# Patient Record
Sex: Female | Born: 1970 | Race: Black or African American | Hispanic: No | Marital: Married | Smoking: Never smoker
Health system: Southern US, Community
[De-identification: ages and names within clinical notes are randomized; demographics above are authoritative.]

## PROBLEM LIST (undated history)

## (undated) DIAGNOSIS — Z9889 Other specified postprocedural states: Secondary | ICD-10-CM

## (undated) DIAGNOSIS — I10 Essential (primary) hypertension: Secondary | ICD-10-CM

## (undated) DIAGNOSIS — D649 Anemia, unspecified: Secondary | ICD-10-CM

## (undated) DIAGNOSIS — R112 Nausea with vomiting, unspecified: Secondary | ICD-10-CM

## (undated) DIAGNOSIS — N62 Hypertrophy of breast: Secondary | ICD-10-CM

## (undated) HISTORY — DX: Essential (primary) hypertension: I10

## (undated) HISTORY — PX: REDUCTION MAMMAPLASTY: SUR839

## (undated) HISTORY — PX: ENDOMETRIAL ABLATION: SHX621

---

## 2005-01-20 HISTORY — PX: OTHER SURGICAL HISTORY: SHX169

## 2005-03-14 ENCOUNTER — Other Ambulatory Visit: Admission: RE | Admit: 2005-03-14 | Discharge: 2005-03-14 | Payer: Self-pay | Admitting: Obstetrics and Gynecology

## 2005-06-26 ENCOUNTER — Encounter (INDEPENDENT_AMBULATORY_CARE_PROVIDER_SITE_OTHER): Payer: Self-pay | Admitting: *Deleted

## 2005-06-26 ENCOUNTER — Inpatient Hospital Stay (HOSPITAL_COMMUNITY): Admission: RE | Admit: 2005-06-26 | Discharge: 2005-06-28 | Payer: Self-pay | Admitting: Obstetrics and Gynecology

## 2008-03-30 ENCOUNTER — Encounter: Admission: RE | Admit: 2008-03-30 | Discharge: 2008-03-30 | Payer: Self-pay | Admitting: Obstetrics and Gynecology

## 2008-11-21 ENCOUNTER — Encounter: Admission: RE | Admit: 2008-11-21 | Discharge: 2008-11-21 | Payer: Self-pay | Admitting: Obstetrics and Gynecology

## 2010-06-07 NOTE — H&P (Signed)
NAME:  Rachael Conley, Rachael Conley NO.:  [id number]   MEDICAL RECORD NO.:  [id number]           PATIENT TYPE:   LOCATION:                                F[facility]ILITY:  [facility]   PHYSICIAN:  Zelphia Cairo, MD    DATE OF BIRTH:  [birth year]   DATE OF ADMISSION:  06/26/2005  DATE OF DISCHARGE:                                HISTORY & PHYSICAL   HISTORY OF PRESENT ILLNESS:  This is a 40 year old black female who  presented initially in November 2006 complaining of irregular bleeding.  She  reported that her menses had been irregular for quite some time and she had  also developed some left lower quadrant pain.  She was evaluated by a  physician in [place] and was told she had a fibroid the size of a tangerine.   PAST MEDICAL HISTORY:  Negative.   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   SOCIAL HISTORY:  Negative for tobacco, alcohol and drug use.  She is  divorced for 2 years.   OBSTETRICAL HISTORY:  OB history is significant for one molar pregnancy.  No  deliveries.   GYNECOLOGICAL HISTORY:  Essentially negative.   MEDICATIONS:  Seasonique.   PHYSICAL EXAMINATION:  VITAL SIGNS:  Height 5 feet 2 inches, weight 125,  blood pressure 120/78.  Hemoglobin initially 7 has increased to 11.2.  HEAD AND NECK:  Normal.  No thyromegaly.  HEART:  Regular rate and rhythm.  LUNGS:  Clear bilaterally.  ABDOMEN:  Soft and nontender.  There is a palpable mass about 2 cm below the  umbilicus.  PELVIC:  Exam reveals normal external female genitalia.  Vagina and cervix  are normal without lesions.  Cervix is tilted downward but there are no  lesions.  Uterus is enlarged about 18 weeks size.  There are no adnexal  masses palpable.   ULTRASOUND:  Ultrasound reveals a large intramural fibroid measuring 8.1 x  6.5 x 7.9 cm.  There are no adnexal masses or free fluid noted.   ASSESSMENT AND PLAN:  This is a 40 year old black female with uterine  fibroid.  Plan for myomectomy.   Risks, benefits and alternatives were  discussed.     Zelphia Cairo, MD  Electronically Signed    GA/MEDQ  D:  06/25/2005  T:  06/25/2005  Job:  [id number]

## 2010-06-07 NOTE — Op Note (Signed)
NAME:  Rachael Conley, Rachael Conley NO.:  [id number]   MEDICAL RECORD NO.:  [id number]          PATIENT TYPE:  INP   LOCATION:  9310                          FACILITY:  [facility]   PHYSICIAN:  Zelphia Cairo, MD    DATE OF BIRTH:  1970/02/18   DATE OF PROCEDURE:  06/26/2005  DATE OF DISCHARGE:                                 OPERATIVE REPORT   PREOPERATIVE DIAGNOSES:  1.  Uterine fibroids.  2.  Anemia.   POSTOPERATIVE DIAGNOSES:  1.  Uterine fibroids.  2.  Anemia.   PROCEDURE:  Abdominal myomectomy.   SURGEON:  Zelphia Cairo, M.D.   ASSISTANT:  Dineen Kid. Rana Snare, M.D.   ESTIMATED BLOOD LOSS:  300 cc.   COMPLICATIONS:  None.   CONDITION:  Stable and extubated to the recovery room.   SPECIMENS:  Uterine fibroids.   DESCRIPTION OF PROCEDURE:  The patient was taken to the operating room where  general anesthesia was obtained.  She was placed in the supine position and  prepped and draped in a sterile fashion.  A Foley catheter was placed in the  bladder prior to draping the patient.   A Pfannenstiel skin incision was made with the scalpel and carried down to  the underlying fascia.  The fascia was incised in the midline, and this was  extended laterally using Mayo scissors.  Kocher clamps were used to grasp  the superior portion of the fascia.  This was tented upwards and dissected  off the underlying rectus muscles using the Bovie.  The Kocher clamps were  then moved to the inferior portion of the fascia which was tented upwards  using Kocher clamps and dissected off the underlying rectus muscles using  the Bovie.  The peritoneum was then identified and entered sharply with  Metzenbaum scissors.  This was extended superiorly and inferiorly with good  visualization of the bladder.  Next, the patient was placed in slight  Trendelenburg position, and the O'Connor-O'Sullivan self-retaining retractor  was placed in the incision.  The bladder blade was inserted, and the  bowel  was packed using one moist lap.  The uterus was brought into the incision.  The large 8 cm uterine fibroid encompassed the entire uterus.  A Vicryl  stitch was placed at the uterine fundus for traction on the uterus.  A  vertical midline incision was made on the uterus using the scalpel.  This  was carried down to the fibroid.  Allis clamps were used to grasp the  uterine wall, and the intracavitary uterine fibroid was shelled out.  Once  the fibroid was removed, the uterine defect was reapproximated using 0  Vicryl pops in a figure-of-eight fashion.  All dead spaces were closed.  Next, the uterine wall was closed with a running stitch in two layers.  The  uterine serosa was reapproximated using 2-0 PDS in a baseball stitch  fashion.  Excellent hemostasis was noted.  Next, three small pedunculated  fibroids were removed from the uterus using the bovie.  These were all less  than 1 cm.  A figure-of-eight suture was placed to provide  hemostasis.  The  pelvis was irrigated, and hemostasis was assured.  Interceed was placed over  the uterine incision.  The bowel packing was removed, and the retractor was  released and removed from the abdomen.  The peritoneum was closed with  Vicryl.  The fascia was closed in a running fashion using 0 PDS, and the  skin was closed with a subcuticular stitch using 3-0 Vicryl.   The patient tolerated the procedure well.  Sponge, lap, needle, and  instrument counts were correct x2.  She received antibiotics prior to skin  incision and was taken to the recovery room extubated and in stable  condition.      Zelphia Cairo, MD  Electronically Signed     GA/MEDQ  D:  06/26/2005  T:  06/27/2005  Job:  [id number]

## 2010-06-07 NOTE — Discharge Summary (Signed)
NAME:  Rachael Conley, Rachael Conley NO.:  [id number]   MEDICAL RECORD NO.:  [id number]          PATIENT TYPE:  INP   LOCATION:  9310                          [facility]ILITY:  [facility]   PHYSICIAN:  Zelphia Cairo, MD    DATE OF BIRTH:  1970/10/08   DATE OF ADMISSION:  06/26/2005  DATE OF DISCHARGE:  06/28/2005                                 DISCHARGE SUMMARY   ADMISSION DIAGNOSIS:  Uterine fibroids.   PROCEDURE:  Abdominal myomectomy.   HOSPITAL COURSE:  Rachael Conley is a 40 year old black female who was admitted to  the hospital with a large uterine fibroid and underwent an abdominal  myomectomy.  Please see operative report for further details of the surgery.  She did well postoperatively and her pain was controlled with IV and p.o.  medications initially.  Postoperative day number one, she was noted to have anemia due to blood loss  at surgery and her hemoglobin was 6.6.  This was to be expected given her  anemia prior to surgery and the amount of blood loss during surgery.  She  was asymptomatic and declined blood transfusion.  She was started on iron  supplementation.  Foley catheter was removed and she was able to void and  ambulate without difficulty postoperative day one.  On postoperative day  number two, she had no complaints.  She was tolerating a regular diet  without nausea and vomiting.  She was ambulating without difficulty and her  pain was controlled with oral medications.  Her abdomen was soft and  nontender.  Her incision was clean, dry, and intact, and she was felt to be  stable for discharge.  She was discharged home with prescriptions for  Percocet and Rafleva and instructed to follow up in the office in  one week  for a postoperative check.      Zelphia Cairo, MD  Electronically Signed     GA/MEDQ  D:  07/26/2005  T:  07/26/2005  Job:  [id number]

## 2011-04-07 ENCOUNTER — Ambulatory Visit (INDEPENDENT_AMBULATORY_CARE_PROVIDER_SITE_OTHER): Payer: BC Managed Care – PPO | Admitting: Family Medicine

## 2011-04-07 VITALS — BP 143/86 | HR 73 | Temp 98.3°F | Resp 16 | Ht 62.0 in | Wt 138.0 lb

## 2011-04-07 DIAGNOSIS — R110 Nausea: Secondary | ICD-10-CM

## 2011-04-07 DIAGNOSIS — G44209 Tension-type headache, unspecified, not intractable: Secondary | ICD-10-CM

## 2011-04-07 DIAGNOSIS — R51 Headache: Secondary | ICD-10-CM

## 2011-04-07 MED ORDER — PROMETHAZINE HCL 25 MG PO TABS: 25.0000 mg | ORAL_TABLET | Freq: Three times a day (TID) | ORAL | Status: AC | PRN

## 2011-04-07 NOTE — Progress Notes (Signed)
  [facility]:  Office Visit  Chief Complaint:  Chief Complaint  Patient presents with  . Fatigue    x 2 weeks  . Dizziness  . Nausea    HPI: Rachael Conley is a 41 y.o. female who complains of  2weeks off and on HA, tried intermittent Advil and Tylenol without relief. No other sxs. Family h/o elevated BP. Feels this may be BP related but she has not kept a log, just knows it's high?  History reviewed. No pertinent past medical history. Past Surgical History  Procedure Date  . Myoectomy 2007   History   Social History  . Marital Status: Single    Spouse Name: N/A    Number of Children: N/A  . Years of Education: N/A   Social History Main Topics  . Smoking status: Never Smoker   . Smokeless tobacco: None  . Alcohol Use: Yes  . Drug Use: No  . Sexually Active: None   Other Topics Concern  . None   Social History Narrative  . None   Family History  Problem Relation Age of Onset  . Heart disease Mother   . Hypertension Mother   . Hypertension Father   . Hyperlipidemia Father   . Hypertension Sister   . Diabetes Sister    No Known Allergies Prior to Admission medications   Medication Sig Start Date End Date Taking? Authorizing Provider  promethazine (PHENERGAN) 25 MG tablet Take 1 tablet (25 mg total) by mouth every 8 (eight) hours as needed for nausea. 04/07/11 04/14/11   P , DO     ROS: The patient denies fevers, chills, night sweats, unintentional weight loss, chest pain, palpitations, wheezing, dyspnea on exertion, nausea, vomiting, abdominal pain, dysuria, hematuria, melena, numbness, weakness, or tingling. + fatigue, HA, dizziness  All other systems have been reviewed and were otherwise negative with the exception of those mentioned in the HPI and as above.    PHYSICAL EXAM: Filed Vitals:   04/07/11 1948  BP: 143/86  Pulse: 73  Temp: 98.3 F (36.8 C)  Resp: 16   Filed Vitals:   04/07/11 1948  Height: 5\' 2"  (1.575 m)    Weight: 138 lb (62.596 kg)   Body mass index is 25.24 kg/(m^2).  General: Alert, no acute distress HEENT:  Normocephalic, atraumatic, oropharynx patent. EOMI PERRLA fundoscopic exam nl Cardiovascular:  Regular rate and rhythm, no rubs murmurs or gallops.  No Carotid bruits, radial pulse intact. No pedal edema.  Respiratory: Clear to auscultation bilaterally.  No wheezes, rales, or rhonchi.  No cyanosis, no use of accessory musculature GI: No organomegaly, abdomen is soft and non-tender, positive bowel sounds.  No masses. Skin: No rashes. Neurologic: Facial musculature symmetric. CN 2-12 normal, no cerebellar abnomralities Psychiatric: Patient is appropriate throughout our interaction. Lymphatic: No cervical lymphadenopathy Musculoskeletal: Gait intact.   LABS: No results found for this or any previous visit.   EKG/XRAY:   Primary read interpreted by Dr. Conley Rolls at [facility].   ASSESSMENT/PLAN: Encounter Diagnoses  Name Primary?  . Nausea Yes  . Headache    1. BP-monitor, call in 1 week if sxs do not improve or sooner if BP >140/90 consistently in next 3 days or sxs worsen 2. Nausea-phenergen 3. HA-Excedrin and phenergan, sleep hygiene.     Hamilton Capri PHUONG, DO 04/07/2011 8:31 PM

## 2011-04-08 ENCOUNTER — Ambulatory Visit (INDEPENDENT_AMBULATORY_CARE_PROVIDER_SITE_OTHER): Payer: BC Managed Care – PPO | Admitting: Family Medicine

## 2011-04-08 VITALS — BP 138/95 | HR 71 | Temp 99.0°F | Resp 16 | Ht 62.75 in | Wt 137.6 lb

## 2011-04-08 DIAGNOSIS — I10 Essential (primary) hypertension: Secondary | ICD-10-CM

## 2011-04-08 MED ORDER — HYDROCHLOROTHIAZIDE 12.5 MG PO TABS: 12.5000 mg | ORAL_TABLET | Freq: Every day | ORAL | Status: DC

## 2011-04-08 NOTE — Progress Notes (Signed)
  [facility]:  Office Visit  Chief Complaint:  Chief Complaint  Patient presents with  . Hypertension    bp high at work 171/106  159/99    HPI: Rachael Conley is a 41 y.o. female who complains of elevated BP without diagnosis.  BP readings at home:  140/90, 151/116, 170/106, 159/99  + HA. NO chest Pain or SOB  No past medical history on file. Past Surgical History  Procedure Date  . Myoectomy 2007   History   Social History  . Marital Status: Single    Spouse Name: N/A    Number of Children: N/A  . Years of Education: N/A   Social History Main Topics  . Smoking status: Never Smoker   . Smokeless tobacco: Not on file  . Alcohol Use: Yes  . Drug Use: No  . Sexually Active: Not on file   Other Topics Concern  . Not on file   Social History Narrative  . No narrative on file   Family History  Problem Relation Age of Onset  . Heart disease Mother   . Hypertension Mother   . Hypertension Father   . Hyperlipidemia Father   . Hypertension Sister   . Diabetes Sister    No Known Allergies Prior to Admission medications   Medication Sig Start Date End Date Taking? Authorizing Provider  promethazine (PHENERGAN) 25 MG tablet Take 1 tablet (25 mg total) by mouth every 8 (eight) hours as needed for nausea. 04/07/11 04/14/11   P , DO     ROS: The patient denies fevers, chills, night sweats, unintentional weight loss, chest pain, palpitations, wheezing, dyspnea on exertion, nausea, vomiting, abdominal pain, dysuria, hematuria, melena, numbness, weakness, or tingling.+HA  All other systems have been reviewed and were otherwise negative with the exception of those mentioned in the HPI and as above.    PHYSICAL EXAM: Filed Vitals:   04/08/11 1430  BP: 138/95  Pulse: 71  Temp:   Resp:    Filed Vitals:   04/08/11 1428  Height: 5' 2.75" (1.594 m)  Weight: 137 lb 9.6 oz (62.415 kg)   Body mass index is 24.57 kg/(m^2).  General: Alert, no  acute distress HEENT:  Normocephalic, atraumatic, oropharynx patent.  Cardiovascular:  Regular rate and rhythm, no rubs murmurs or gallops.  No Carotid bruits, radial pulse intact. No pedal edema.  Respiratory: Clear to auscultation bilaterally.  No wheezes, rales, or rhonchi.  No cyanosis, no use of accessory musculature GI: No organomegaly, abdomen is soft and non-tender, positive bowel sounds.  No masses. Skin: No rashes. Neurologic: Facial musculature symmetric. Psychiatric: Patient is appropriate throughout our interaction. Lymphatic: No cervical lymphadenopathy Musculoskeletal: Gait intact.   LABS: No results found for this or any previous visit.   EKG/XRAY:   Primary read interpreted by Dr. Conley Rolls at [facility].   ASSESSMENT/PLAN: Encounter Diagnosis  Name Primary?  . HTN (hypertension) Yes   Newly dx. Rx HCTZ 12.5 mg. WIll keep BP log < 140/90. F/u in 1-2 months sooner as needed  BMP pending. D/w patient SEs of meds   ,  PHUONG, DO 04/08/2011 2:42 PM

## 2011-04-09 LAB — BASIC METABOLIC PANEL
BUN: 10 mg/dL (ref 6–23)
Calcium: 9.1 mg/dL (ref 8.4–10.5)
Creat: 0.81 mg/dL (ref 0.50–1.10)
Glucose, Bld: 91 mg/dL (ref 70–99)
Sodium: 137 mEq/L (ref 135–145)

## 2011-04-09 LAB — BASIC METABOLIC PANEL WITH GFR
CO2: 23 meq/L (ref 19–32)
Chloride: 106 meq/L (ref 96–112)
Potassium: 3.8 meq/L (ref 3.5–5.3)

## 2011-04-11 DIAGNOSIS — I10 Essential (primary) hypertension: Secondary | ICD-10-CM | POA: Insufficient documentation

## 2011-06-19 ENCOUNTER — Ambulatory Visit (INDEPENDENT_AMBULATORY_CARE_PROVIDER_SITE_OTHER): Payer: BC Managed Care – PPO | Admitting: Family Medicine

## 2011-06-19 VITALS — BP 130/80 | HR 71 | Temp 98.8°F | Resp 16 | Ht 62.5 in | Wt 135.0 lb

## 2011-06-19 DIAGNOSIS — I10 Essential (primary) hypertension: Secondary | ICD-10-CM

## 2011-06-19 LAB — BASIC METABOLIC PANEL
Calcium: 9.4 mg/dL (ref 8.4–10.5)
Sodium: 139 mEq/L (ref 135–145)

## 2011-06-19 MED ORDER — HYDROCHLOROTHIAZIDE 12.5 MG PO TABS: 12.5000 mg | ORAL_TABLET | Freq: Every day | ORAL | Status: DC

## 2011-06-19 NOTE — Progress Notes (Signed)
  Patient Name: Rachael Conley Date of Birth: Sep 10, 1970 Medical Record Number: [id number] Gender: female Date of Encounter: 06/19/2011  History of Present Illness:  Rachael Conley is a 41 y.o. very pleasant female patient who presents with the following:  Here today to recheck her BP.  At home her BP is running around 138/ 80.  She tried to stop her HCTZ but her BP went back up to around  140's/ 100.  She is doing well and feeling well with the medication.  She states there is no risk of her becoming pregnant. She has a strong family history of hypertension, and is exercising regularly.  She does not smoke- never has   Patient Active Problem List  Diagnoses  . HTN, goal below 140/90   No past medical history on file. Past Surgical History  Procedure Date  . Myoectomy 2007   History  Substance Use Topics  . Smoking status: Never Smoker   . Smokeless tobacco: Not on file  . Alcohol Use: Yes   Family History  Problem Relation Age of Onset  . Heart disease Mother   . Hypertension Mother   . Hypertension Father   . Hyperlipidemia Father   . Hypertension Sister   . Diabetes Sister    No Known Allergies  Medication list has been reviewed and updated.  Prior to Admission medications   Medication Sig Start Date End Date Taking? Authorizing Provider  hydrochlorothiazide (HYDRODIURIL) 12.5 MG tablet Take 1 tablet (12.5 mg total) by mouth daily. 04/08/11 04/07/12 Yes Thao P Le, DO    Review of Systems: As per HPI- otherwise negative. Feeling well  Physical Examination: Filed Vitals:   06/19/11 0805  BP: 130/80  Pulse: 71  Temp: 98.8 F (37.1 C)  Resp: 16   Filed Vitals:   06/19/11 0805  Height: 5' 2.5" (1.588 m)  Weight: 135 lb (61.236 kg)   Body mass index is 24.30 kg/(m^2).  GEN: WDWN, NAD, Non-toxic, A & O x 3 HEENT: Atraumatic, Normocephalic. Neck supple. No masses, No LAD.  PEERL Ears and Nose: No external deformity. CV: RRR, No M/G/R. No JVD. No thrill. No  extra heart sounds. PULM: CTA B, no wheezes, crackles, rhonchi. No retractions. No resp. distress. No accessory muscle use. EXTR: No c/c/e NEURO Normal gait.  PSYCH: Normally interactive. Conversant. Not depressed or anxious appearing.  Calm demeanor.    Assessment and Plan: 1. HTN (hypertension)  hydrochlorothiazide (HYDRODIURIL) 12.5 MG tablet, Basic metabolic panel   Doing well with current medication- will continue HCTZ 12.5, did RF for one year.  Check BMP- Will plan further follow- up pending labs. Will plan to recheck in 6-9 months.  Continue to exercise  Waynette Towers, MD 06/19/2011 8:07 AM

## 2011-06-20 ENCOUNTER — Encounter: Payer: Self-pay | Admitting: Family Medicine

## 2011-11-20 ENCOUNTER — Other Ambulatory Visit: Payer: Self-pay | Admitting: Obstetrics and Gynecology

## 2011-11-20 DIAGNOSIS — N63 Unspecified lump in unspecified breast: Secondary | ICD-10-CM

## 2011-11-27 ENCOUNTER — Ambulatory Visit
Admission: RE | Admit: 2011-11-27 | Discharge: 2011-11-27 | Disposition: A | Discharge status: Home / Self Care | Payer: BC Managed Care – PPO | Source: Ambulatory Visit | Attending: Obstetrics and Gynecology | Admitting: Obstetrics and Gynecology

## 2011-11-27 DIAGNOSIS — N63 Unspecified lump in unspecified breast: Secondary | ICD-10-CM

## 2012-05-03 ENCOUNTER — Other Ambulatory Visit: Payer: Self-pay | Admitting: Obstetrics and Gynecology

## 2012-05-03 DIAGNOSIS — N63 Unspecified lump in unspecified breast: Secondary | ICD-10-CM

## 2012-05-27 ENCOUNTER — Ambulatory Visit
Admission: RE | Admit: 2012-05-27 | Discharge: 2012-05-27 | Disposition: A | Discharge status: Home / Self Care | Payer: BC Managed Care – PPO | Source: Ambulatory Visit | Attending: Obstetrics and Gynecology | Admitting: Obstetrics and Gynecology

## 2012-05-27 ENCOUNTER — Other Ambulatory Visit: Payer: Self-pay | Admitting: Obstetrics and Gynecology

## 2012-05-27 DIAGNOSIS — N63 Unspecified lump in unspecified breast: Secondary | ICD-10-CM

## 2012-09-17 ENCOUNTER — Ambulatory Visit (INDEPENDENT_AMBULATORY_CARE_PROVIDER_SITE_OTHER): Payer: BC Managed Care – PPO | Admitting: Family Medicine

## 2012-09-17 VITALS — BP 124/78 | HR 55 | Temp 99.2°F | Resp 17 | Ht 62.5 in | Wt 137.0 lb

## 2012-09-17 DIAGNOSIS — I10 Essential (primary) hypertension: Secondary | ICD-10-CM

## 2012-09-17 MED ORDER — HYDROCHLOROTHIAZIDE 12.5 MG PO TABS
12.5000 mg | ORAL_TABLET | Freq: Every day | ORAL | Status: DC
Start: 2012-09-17 — End: 2013-11-10

## 2012-09-17 NOTE — Progress Notes (Signed)
  Subjective:    Patient ID: Rachael Conley, female    DOB: January 20, 1971, 42 y.o.   MRN: [id number]  HPI Rachael Conley is a 42 y.o. female PCP: [facility].   Follow up HTN. Last ov with Dr. Patsy Lager in 05/2011. Weight 135 then, 137 today. No recent outside Bp's.  Has been taking hctz 12.5mg  qd, no recent missed doses, no new side effects.  No chest pain/sob/dizziness. Started back exercise this week 2-3x/week.  Fasting this morning. Had cholesterol at job screening in January.   Results for orders placed in visit on 06/19/11  BASIC METABOLIC PANEL      Result Value Range   Sodium 139  135 - 145 mEq/L   Potassium 3.7  3.5 - 5.3 mEq/L   Chloride 103  96 - 112 mEq/L   CO2 29  19 - 32 mEq/L   Glucose, Bld 88  70 - 99 mg/dL   BUN 15  6 - 23 mg/dL   Creat 4.09  8.11 - 9.14 mg/dL   Calcium 9.4  8.4 - 78.2 mg/dL     Review of Systems  Constitutional: Negative for fatigue and unexpected weight change.  Respiratory: Negative for chest tightness and shortness of breath.   Cardiovascular: Negative for chest pain, palpitations and leg swelling.  Gastrointestinal: Negative for abdominal pain and blood in stool.  Neurological: Negative for dizziness, syncope, light-headedness and headaches.       Objective:   Physical Exam  Vitals reviewed. Constitutional: She is oriented to person, place, and time. She appears well-developed and well-nourished.  HENT:  Head: Normocephalic and atraumatic.  Eyes: Conjunctivae and EOM are normal. Pupils are equal, round, and reactive to light.  Neck: Carotid bruit is not present.  Cardiovascular: Normal rate, regular rhythm, normal heart sounds and intact distal pulses.   Pulmonary/Chest: Effort normal and breath sounds normal.  Abdominal: Soft. She exhibits no pulsatile midline mass. There is no tenderness.  Neurological: She is alert and oriented to person, place, and time.  Skin: Skin is warm and dry.  Psychiatric: She has a normal mood and affect. Her  behavior is normal.       Assessment & Plan:  Rachael Conley is a 42 y.o. female HTN (hypertension) - Plan: hydrochlorothiazide (HYDRODIURIL) 12.5 MG tablet, Comprehensive metabolic panel, Lipid panel  htn - controlled. Labs above, schedule physical with selected primary provider in next year. Bring outside BP's to next ov.   Meds ordered this encounter  Medications  . hydrochlorothiazide (HYDRODIURIL) 12.5 MG tablet    Sig: Take 1 tablet (12.5 mg total) by mouth daily.    Dispense:  90 tablet    Refill:  3   Patient Instructions  Keep a record of your blood pressures outside of the office and bring them to the next office visit. You should receive a call or letter about your lab results within the next week to 10 days.  Plan on physical with primary provider in next 6 months to a year.

## 2012-09-17 NOTE — Patient Instructions (Signed)
Keep a record of your blood pressures outside of the office and bring them to the next office visit. You should receive a call or letter about your lab results within the next week to 10 days.  Plan on physical with primary provider in next 6 months to a year.

## 2012-09-18 LAB — LIPID PANEL: Cholesterol: 189 mg/dL (ref 0–200)

## 2012-09-18 LAB — COMPREHENSIVE METABOLIC PANEL
ALT: 12 U/L (ref 0–35)
AST: 28 U/L (ref 0–37)
CO2: 28 mEq/L (ref 19–32)
Calcium: 9.5 mg/dL (ref 8.4–10.5)
Chloride: 103 mEq/L (ref 96–112)
Potassium: 3.6 mEq/L (ref 3.5–5.3)
Sodium: 139 mEq/L (ref 135–145)
Total Protein: 7.4 g/dL (ref 6.0–8.3)

## 2012-10-27 ENCOUNTER — Other Ambulatory Visit: Payer: Self-pay | Admitting: Obstetrics and Gynecology

## 2012-10-27 DIAGNOSIS — N649 Disorder of breast, unspecified: Secondary | ICD-10-CM

## 2012-11-29 ENCOUNTER — Ambulatory Visit
Admission: RE | Admit: 2012-11-29 | Discharge: 2012-11-29 | Disposition: A | Discharge status: Home / Self Care | Payer: BC Managed Care – PPO | Source: Ambulatory Visit | Attending: Obstetrics and Gynecology | Admitting: Obstetrics and Gynecology

## 2012-11-29 DIAGNOSIS — N649 Disorder of breast, unspecified: Secondary | ICD-10-CM

## 2013-05-24 ENCOUNTER — Other Ambulatory Visit: Payer: Self-pay | Admitting: Obstetrics and Gynecology

## 2013-05-24 DIAGNOSIS — D249 Benign neoplasm of unspecified breast: Secondary | ICD-10-CM

## 2013-06-15 ENCOUNTER — Encounter (HOSPITAL_BASED_OUTPATIENT_CLINIC_OR_DEPARTMENT_OTHER): Payer: Self-pay | Admitting: *Deleted

## 2013-06-20 ENCOUNTER — Ambulatory Visit
Admission: RE | Admit: 2013-06-20 | Discharge: 2013-06-20 | Disposition: A | Discharge status: Home / Self Care | Payer: BC Managed Care – PPO | Source: Ambulatory Visit | Attending: Obstetrics and Gynecology | Admitting: Obstetrics and Gynecology

## 2013-06-20 ENCOUNTER — Encounter (INDEPENDENT_AMBULATORY_CARE_PROVIDER_SITE_OTHER): Payer: Self-pay

## 2013-06-20 ENCOUNTER — Encounter (HOSPITAL_BASED_OUTPATIENT_CLINIC_OR_DEPARTMENT_OTHER)
Admission: RE | Admit: 2013-06-20 | Discharge: 2013-06-20 | Disposition: A | Discharge status: Home / Self Care | Payer: BC Managed Care – PPO | Source: Ambulatory Visit

## 2013-06-20 DIAGNOSIS — D249 Benign neoplasm of unspecified breast: Secondary | ICD-10-CM

## 2013-06-20 LAB — BASIC METABOLIC PANEL
BUN: 13 mg/dL (ref 6–23)
CHLORIDE: 101 meq/L (ref 96–112)
CO2: 27 meq/L (ref 19–32)
Calcium: 9.3 mg/dL (ref 8.4–10.5)
Creatinine, Ser: 0.95 mg/dL (ref 0.50–1.10)
GFR calc Af Amer: 84 mL/min — ABNORMAL LOW (ref >90)
GFR calc non Af Amer: 73 mL/min — ABNORMAL LOW (ref >90)
Glucose, Bld: 94 mg/dL (ref 70–99)
POTASSIUM: 3.6 meq/L — AB (ref 3.7–5.3)
Sodium: 139 mEq/L (ref 137–147)

## 2013-06-21 ENCOUNTER — Encounter (HOSPITAL_BASED_OUTPATIENT_CLINIC_OR_DEPARTMENT_OTHER)
Admission: RE | Disposition: A | Discharge status: Home / Self Care | Payer: Self-pay | Source: Ambulatory Visit | Attending: Plastic Surgery

## 2013-06-21 ENCOUNTER — Ambulatory Visit (HOSPITAL_BASED_OUTPATIENT_CLINIC_OR_DEPARTMENT_OTHER): Payer: BC Managed Care – PPO | Admitting: Anesthesiology

## 2013-06-21 ENCOUNTER — Encounter (HOSPITAL_BASED_OUTPATIENT_CLINIC_OR_DEPARTMENT_OTHER): Payer: Self-pay | Admitting: *Deleted

## 2013-06-21 ENCOUNTER — Ambulatory Visit (HOSPITAL_BASED_OUTPATIENT_CLINIC_OR_DEPARTMENT_OTHER)
Admission: RE | Admit: 2013-06-21 | Discharge: 2013-06-21 | Disposition: A | Discharge status: Home / Self Care | Payer: BC Managed Care – PPO | Source: Ambulatory Visit | Attending: Plastic Surgery | Admitting: Plastic Surgery

## 2013-06-21 ENCOUNTER — Encounter (HOSPITAL_BASED_OUTPATIENT_CLINIC_OR_DEPARTMENT_OTHER): Payer: BC Managed Care – PPO | Admitting: Anesthesiology

## 2013-06-21 DIAGNOSIS — D249 Benign neoplasm of unspecified breast: Secondary | ICD-10-CM | POA: Insufficient documentation

## 2013-06-21 DIAGNOSIS — N6019 Diffuse cystic mastopathy of unspecified breast: Secondary | ICD-10-CM | POA: Insufficient documentation

## 2013-06-21 DIAGNOSIS — I10 Essential (primary) hypertension: Secondary | ICD-10-CM | POA: Insufficient documentation

## 2013-06-21 DIAGNOSIS — N6089 Other benign mammary dysplasias of unspecified breast: Secondary | ICD-10-CM | POA: Insufficient documentation

## 2013-06-21 DIAGNOSIS — N62 Hypertrophy of breast: Secondary | ICD-10-CM | POA: Insufficient documentation

## 2013-06-21 DIAGNOSIS — Z79899 Other long term (current) drug therapy: Secondary | ICD-10-CM | POA: Insufficient documentation

## 2013-06-21 HISTORY — PX: BREAST REDUCTION SURGERY: SHX8

## 2013-06-21 HISTORY — DX: Hypertrophy of breast: N62

## 2013-06-21 HISTORY — DX: Anemia, unspecified: D64.9

## 2013-06-21 LAB — POCT HEMOGLOBIN-HEMACUE: HEMOGLOBIN: 12.2 g/dL (ref 12.0–15.0)

## 2013-06-21 SURGERY — MAMMOPLASTY, REDUCTION
Anesthesia: General | Site: Breast | Laterality: Bilateral

## 2013-06-21 MED ORDER — FENTANYL CITRATE 0.05 MG/ML IJ SOLN
INTRAMUSCULAR | Status: DC | PRN
  Administered 2013-06-21 (×4): 50 ug via INTRAVENOUS
  Administered 2013-06-21: 100 ug via INTRAVENOUS
  Administered 2013-06-21 (×2): 50 ug via INTRAVENOUS

## 2013-06-21 MED ORDER — LIDOCAINE HCL (CARDIAC) 20 MG/ML IV SOLN
INTRAVENOUS | Status: DC | PRN
  Administered 2013-06-21: 60 mg via INTRAVENOUS

## 2013-06-21 MED ORDER — CEFAZOLIN SODIUM 1-5 GM-% IV SOLN: 1.0000 g | Freq: Once | INTRAVENOUS | Status: DC

## 2013-06-21 MED ORDER — FENTANYL CITRATE 0.05 MG/ML IJ SOLN
INTRAMUSCULAR | Status: AC
  Filled 2013-06-21: qty 8

## 2013-06-21 MED ORDER — LIDOCAINE-EPINEPHRINE 1 %-1:100000 IJ SOLN
INTRAMUSCULAR | Status: DC | PRN
  Administered 2013-06-21: 40 mL

## 2013-06-21 MED ORDER — CEFAZOLIN SODIUM-DEXTROSE 2-3 GM-% IV SOLR
INTRAVENOUS | Status: AC
Start: 2013-06-21 — End: 2013-06-21
  Filled 2013-06-21: qty 50

## 2013-06-21 MED ORDER — PROPOFOL 10 MG/ML IV BOLUS
INTRAVENOUS | Status: DC | PRN
  Administered 2013-06-21: 200 mg via INTRAVENOUS

## 2013-06-21 MED ORDER — EPINEPHRINE HCL 1 MG/ML IJ SOLN
INTRAMUSCULAR | Status: AC
  Filled 2013-06-21: qty 1

## 2013-06-21 MED ORDER — BUPIVACAINE LIPOSOME 1.3 % IJ SUSP
INTRAMUSCULAR | Status: AC
  Filled 2013-06-21: qty 20

## 2013-06-21 MED ORDER — LACTATED RINGERS IV SOLN
INTRAVENOUS | Status: DC
  Administered 2013-06-21 (×2): via INTRAVENOUS

## 2013-06-21 MED ORDER — MIDAZOLAM HCL 2 MG/2ML IJ SOLN
INTRAMUSCULAR | Status: AC
  Filled 2013-06-21: qty 2

## 2013-06-21 MED ORDER — BUPIVACAINE-EPINEPHRINE (PF) 0.5% -1:200000 IJ SOLN
INTRAMUSCULAR | Status: AC
  Filled 2013-06-21: qty 30

## 2013-06-21 MED ORDER — LIDOCAINE HCL (PF) 1 % IJ SOLN
INTRAMUSCULAR | Status: AC
  Filled 2013-06-21: qty 30

## 2013-06-21 MED ORDER — MIDAZOLAM HCL 2 MG/2ML IJ SOLN: 1.0000 mg | INTRAMUSCULAR | Status: DC | PRN

## 2013-06-21 MED ORDER — CEFAZOLIN SODIUM-DEXTROSE 2-3 GM-% IV SOLR
INTRAVENOUS | Status: DC | PRN
  Administered 2013-06-21: 2 g via INTRAVENOUS

## 2013-06-21 MED ORDER — HYDROMORPHONE HCL PF 1 MG/ML IJ SOLN
0.2500 mg | INTRAMUSCULAR | Status: DC | PRN
  Administered 2013-06-21 (×2): 0.5 mg via INTRAVENOUS

## 2013-06-21 MED ORDER — BACITRACIN ZINC 500 UNIT/GM EX OINT
TOPICAL_OINTMENT | CUTANEOUS | Status: AC
Start: 2013-06-21 — End: 2013-06-21
  Filled 2013-06-21: qty 28.35

## 2013-06-21 MED ORDER — ONDANSETRON HCL 4 MG/2ML IJ SOLN: 4.0000 mg | Freq: Once | INTRAMUSCULAR | Status: DC | PRN

## 2013-06-21 MED ORDER — LIDOCAINE-EPINEPHRINE 1 %-1:100000 IJ SOLN
INTRAMUSCULAR | Status: AC
  Filled 2013-06-21: qty 1

## 2013-06-21 MED ORDER — DEXAMETHASONE SODIUM PHOSPHATE 4 MG/ML IJ SOLN
INTRAMUSCULAR | Status: DC | PRN
  Administered 2013-06-21: 10 mg via INTRAVENOUS

## 2013-06-21 MED ORDER — FENTANYL CITRATE 0.05 MG/ML IJ SOLN: 50.0000 ug | INTRAMUSCULAR | Status: DC | PRN

## 2013-06-21 MED ORDER — HYDROMORPHONE HCL PF 1 MG/ML IJ SOLN
INTRAMUSCULAR | Status: AC
  Filled 2013-06-21: qty 1

## 2013-06-21 MED ORDER — ROCURONIUM BROMIDE 100 MG/10ML IV SOLN
INTRAVENOUS | Status: DC | PRN
  Administered 2013-06-21: 35 mg via INTRAVENOUS

## 2013-06-21 MED ORDER — CEFAZOLIN SODIUM-DEXTROSE 2-3 GM-% IV SOLR: INTRAVENOUS | Status: DC | PRN

## 2013-06-21 MED ORDER — ONDANSETRON HCL 4 MG/2ML IJ SOLN
INTRAMUSCULAR | Status: DC | PRN
  Administered 2013-06-21: 4 mg via INTRAVENOUS

## 2013-06-21 MED ORDER — BACITRACIN ZINC 500 UNIT/GM EX OINT
TOPICAL_OINTMENT | CUTANEOUS | Status: DC | PRN
  Administered 2013-06-21: 1 via TOPICAL

## 2013-06-21 MED ORDER — MIDAZOLAM HCL 5 MG/5ML IJ SOLN
INTRAMUSCULAR | Status: DC | PRN
  Administered 2013-06-21: 2 mg via INTRAVENOUS

## 2013-06-21 MED ORDER — BUPIVACAINE LIPOSOME 1.3 % IJ SUSP
INTRAMUSCULAR | Status: DC | PRN
  Administered 2013-06-21: 20 mL

## 2013-06-21 SURGICAL SUPPLY — 60 items
APL SKNCLS STERI-STRIP NONHPOA (GAUZE/BANDAGES/DRESSINGS) ×2
BAG DECANTER FOR FLEXI CONT (MISCELLANEOUS) ×5 IMPLANT
BENZOIN TINCTURE PRP APPL 2/3 (GAUZE/BANDAGES/DRESSINGS) ×6 IMPLANT
BLADE KNIFE PERSONA 10 (BLADE) ×12 IMPLANT
BLADE KNIFE PERSONA 15 (BLADE) ×9 IMPLANT
BNDG GAUZE ELAST 4 BULKY (GAUZE/BANDAGES/DRESSINGS) ×6 IMPLANT
CANISTER SUCT 1200ML W/VALVE (MISCELLANEOUS) ×3 IMPLANT
CAP BOUFFANT 24 BLUE NURSES (PROTECTIVE WEAR) ×3 IMPLANT
CLOSURE WOUND 1/2 X4 (GAUZE/BANDAGES/DRESSINGS) ×4
COVER MAYO STAND STRL (DRAPES) ×3 IMPLANT
COVER TABLE BACK 60X90 (DRAPES) ×3 IMPLANT
DECANTER SPIKE VIAL GLASS SM (MISCELLANEOUS) ×6 IMPLANT
DRAIN CHANNEL 10F 3/8 F FF (DRAIN) ×6 IMPLANT
DRAPE U-SHAPE 76X120 STRL (DRAPES) ×6 IMPLANT
DRSG EMULSION OIL 3X3 NADH (GAUZE/BANDAGES/DRESSINGS) ×6 IMPLANT
DRSG PAD ABDOMINAL 8X10 ST (GAUZE/BANDAGES/DRESSINGS) ×6 IMPLANT
ELECT REM PT RETURN 9FT ADLT (ELECTROSURGICAL) ×3
ELECTRODE REM PT RTRN 9FT ADLT (ELECTROSURGICAL) ×1 IMPLANT
EVACUATOR SILICONE 100CC (DRAIN) ×6 IMPLANT
FILTER 7/8 IN (FILTER) ×3 IMPLANT
GAUZE SPONGE 4X4 12PLY STRL (GAUZE/BANDAGES/DRESSINGS) ×6 IMPLANT
GLOVE BIO SURGEON STRL SZ7 (GLOVE) ×3 IMPLANT
GLOVE BIOGEL PI IND STRL 7.0 (GLOVE) IMPLANT
GLOVE BIOGEL PI INDICATOR 7.0 (GLOVE) ×2
GLOVE ECLIPSE 6.5 STRL STRAW (GLOVE) ×4 IMPLANT
GLOVE EXAM NITRILE PF MED BLUE (GLOVE) ×4 IMPLANT
GOWN STRL REUS W/ TWL LRG LVL3 (GOWN DISPOSABLE) ×2 IMPLANT
GOWN STRL REUS W/TWL LRG LVL3 (GOWN DISPOSABLE) ×6
NDL HYPO 25X1 1.5 SAFETY (NEEDLE) ×2 IMPLANT
NDL SPNL 18GX3.5 QUINCKE PK (NEEDLE) ×1 IMPLANT
NEEDLE HYPO 25X1 1.5 SAFETY (NEEDLE) ×6 IMPLANT
NEEDLE SPNL 18GX3.5 QUINCKE PK (NEEDLE) ×3 IMPLANT
NS IRRIG 1000ML POUR BTL (IV SOLUTION) ×6 IMPLANT
PACK BASIN DAY SURGERY FS (CUSTOM PROCEDURE TRAY) ×3 IMPLANT
PIN SAFETY STERILE (MISCELLANEOUS) ×3 IMPLANT
SCRUB PCMX 4 OZ (MISCELLANEOUS) ×3 IMPLANT
SLEEVE SCD COMPRESS KNEE MED (MISCELLANEOUS) ×3 IMPLANT
SPECIMEN JAR MEDIUM (MISCELLANEOUS) ×9 IMPLANT
SPECIMEN JAR X LARGE (MISCELLANEOUS) IMPLANT
SPONGE LAP 18X18 X RAY DECT (DISPOSABLE) ×9 IMPLANT
STAPLER VISISTAT 35W (STAPLE) ×6 IMPLANT
STRIP CLOSURE SKIN 1/2X4 (GAUZE/BANDAGES/DRESSINGS) ×8 IMPLANT
SUT ETHILON 3 0 PS 1 (SUTURE) ×3 IMPLANT
SUT MNCRL AB 3-0 PS2 18 (SUTURE) ×12 IMPLANT
SUT MNCRL AB 4-0 PS2 18 (SUTURE) ×6 IMPLANT
SUT MON AB 5-0 PS2 18 (SUTURE) ×6 IMPLANT
SUT PROLENE 2 0 CT2 30 (SUTURE) ×3 IMPLANT
SUT PROLENE 3 0 PS 1 (SUTURE) ×6 IMPLANT
SUT QUILL PDO 2-0 (SUTURE) ×6 IMPLANT
SYR BULB IRRIGATION 50ML (SYRINGE) ×6 IMPLANT
SYR CONTROL 10ML LL (SYRINGE) ×9 IMPLANT
TOWEL OR 17X24 6PK STRL BLUE (TOWEL DISPOSABLE) ×9 IMPLANT
TOWEL OR NON WOVEN STRL DISP B (DISPOSABLE) ×3 IMPLANT
TRAY DSU PREP LF (CUSTOM PROCEDURE TRAY) ×3 IMPLANT
TRAY FOLEY CATH 14FR (SET/KITS/TRAYS/PACK) ×3 IMPLANT
TUBE CONNECTING 20'X1/4 (TUBING) ×1
TUBE CONNECTING 20X1/4 (TUBING) ×2 IMPLANT
UNDERPAD 30X30 INCONTINENT (UNDERPADS AND DIAPERS) ×6 IMPLANT
VAC PENCILS W/TUBING CLEAR (MISCELLANEOUS) ×3 IMPLANT
YANKAUER SUCT BULB TIP NO VENT (SUCTIONS) ×3 IMPLANT

## 2013-06-21 NOTE — Anesthesia Procedure Notes (Signed)
Procedure Name: Intubation Date/Time: 06/21/2013 7:59 AM Performed by: Marrianne Mood Pre-anesthesia Checklist: Patient identified, Emergency Drugs available, Suction available, Patient being monitored and Timeout performed Patient Re-evaluated:Patient Re-evaluated prior to inductionOxygen Delivery Method: Circle System Utilized Preoxygenation: Pre-oxygenation with 100% oxygen Intubation Type: IV induction Ventilation: Mask ventilation without difficulty Laryngoscope Size: Miller and 3 Grade View: Grade III Tube type: Oral Tube size: 7.0 mm Number of attempts: 1 Airway Equipment and Method: stylet and oral airway Placement Confirmation: ETT inserted through vocal cords under direct vision,  positive ETCO2 and breath sounds checked- equal and bilateral Tube secured with: Tape Dental Injury: Teeth and Oropharynx as per pre-operative assessment

## 2013-06-21 NOTE — Transfer of Care (Signed)
Immediate Anesthesia Transfer of Care Note  Patient: Rachael Conley  Procedure(s) Performed: Procedure(s): BILATERAL MAMMARY REDUCTION  (BREAST) (Bilateral)  Patient Location: PACU  Anesthesia Type:General  Level of Consciousness: sedated and patient cooperative  Airway & Oxygen Therapy: Patient Spontanous Breathing and Patient connected to face mask oxygen  Post-op Assessment: Report given to PACU RN and Post -op Vital signs reviewed and stable  Post vital signs: Reviewed and stable  Complications: No apparent anesthesia complications

## 2013-06-21 NOTE — Brief Op Note (Signed)
06/21/2013  11:24 AM  PATIENT:  Rachael Conley  43 y.o. 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]  PROCEDURE:  Procedure(s): BILATERAL MAMMARY REDUCTION  (BREAST) (Bilateral)  SURGEON:  Surgeon(s) and Role:    * Mary A Contogiannis, MD - Primary  ANESTHESIA:   general  EBL:  Total I/O In: 1400 [I.V.:1400] Out: 150 [Urine:150]  BLOOD ADMINISTERED:none  DRAINS: (48F) Jackson-Pratt drain(s) with closed bulb suction in the Bilateral Breasts   LOCAL MEDICATIONS USED:  OTHER 1.3% Exparel (266mg s.)  SPECIMEN:  Source of Specimen:  Bilateral Breasts  DISPOSITION OF SPECIMEN:  PATHOLOGY  COUNTS:  YES  DICTATION: .Other Dictation: Dictation Number [id number]  PLAN OF CARE: Discharge to home after PACU  PATIENT DISPOSITION:  PACU - hemodynamically stable.   Delay start of Pharmacological VTE agent (>24hrs) due to surgical blood loss or risk of bleeding: not applicable

## 2013-06-21 NOTE — H&P (Signed)
  H&P faxed to surgical center.  -History and Physical Reviewed  -Patient has been re-examined  -No change in the plan of care  Rachael Conley A    

## 2013-06-21 NOTE — Discharge Instructions (Signed)
1. No lifting greater than 5 lbs with arms for 4 weeks. °2. Empty, strip, record and reactivate JP drains 3 times a day. °3. Percocet 5/325 mg tabs 1-2 tabs po q 4-6 hours prn pain- prescription given in office. °4. Duricef 1 tab po bid- prescription given in office. °5. Sterapred dose pack as directed- prescription given in office. °6. Follow-up appointment Friday in office.  ° ° ° °Post Anesthesia Home Care Instructions ° °Activity: °Get plenty of rest for the remainder of the day. A responsible adult should stay with you for 24 hours following the procedure.  °For the next 24 hours, DO NOT: °-Drive a car °-Operate machinery °-Drink alcoholic beverages °-Take any medication unless instructed by your physician °-Make any legal decisions or sign important papers. ° °Meals: °Start with liquid foods such as gelatin or soup. Progress to regular foods as tolerated. Avoid greasy, spicy, heavy foods. If nausea and/or vomiting occur, drink only clear liquids until the nausea and/or vomiting subsides. Call your physician if vomiting continues. ° °Special Instructions/Symptoms: °Your throat may feel dry or sore from the anesthesia or the breathing tube placed in your throat during surgery. If this causes discomfort, gargle with warm salt water. The discomfort should disappear within 24 hours. °Information for Discharge Teaching: °EXPAREL (bupivacaine liposome injectable suspension)  ° °Your surgeon gave you EXPAREL(bupivacaine) in your surgical incision to help control your pain after surgery.  °· EXPAREL is a local anesthetic that provides pain relief by numbing the tissue around the surgical site. °· EXPAREL is designed to release pain medication over time and can control pain for up to 72 hours. °· Depending on how you respond to EXPAREL, you may require less pain medication during your recovery. ° °Possible side effects: °· Temporary loss of sensation or ability to move in the area where bupivacaine was  injected. °· Nausea, vomiting, constipation °· Rarely, numbness and tingling in your mouth or lips, lightheadedness, or anxiety may occur. °· Call your doctor right away if you think you may be experiencing any of these sensations, or if you have other questions regarding possible side effects. ° °Follow all other discharge instructions given to you by your surgeon or nurse. Eat a healthy diet and drink plenty of water or other fluids. ° °If you return to the hospital for any reason within 96 hours following the administration of EXPAREL, please inform your health care providers.About my Jackson-Pratt Bulb Drain ° °What is a Jackson-Pratt bulb? °A Jackson-Pratt is a soft, round device used to collect drainage. It is connected to a long, thin drainage catheter, which is held in place by one or two small stiches near your surgical incision site. When the bulb is squeezed, it forms a vacuum, forcing the drainage to empty into the bulb. ° °Emptying the Jackson-Pratt bulb- °To empty the bulb: °1. Release the plug on the top of the bulb. °2. Pour the bulb's contents into a measuring container which your nurse will provide. °3. Record the time emptied and amount of drainage. Empty the drain(s) as often as your     doctor or nurse recommends. ° °Date                  Time                    Amount (Drain 1)                 Amount (Drain  2) ° °_____________________________________________________________________ ° °_____________________________________________________________________ ° °  _____________________________________________________________________ ° °_____________________________________________________________________ ° °_____________________________________________________________________ ° °_____________________________________________________________________ ° °_____________________________________________________________________ ° °_____________________________________________________________________ ° °Squeezing the Jackson-Pratt Bulb- °To squeeze the bulb: °1. Make sure the plug at the top of the bulb is open. °2. Squeeze the bulb tightly in your fist. You will hear air squeezing from the bulb. °3. Replace the plug while the bulb is squeezed. °4. Use a safety pin to attach the bulb to your clothing. This will keep the catheter from     pulling at the bulb insertion site. ° °When to call your doctor- °Call your doctor if: °· Drain site becomes red, swollen or hot. °· You have a fever greater than 101 degrees F. °· There is oozing at the drain site. °· Drain falls out (apply a guaze bandage over the drain hole and secure it with tape). °· Drainage increases daily not related to activity patterns. (You will usually have more drainage when you are active than when you are resting.) °· Drainage has a bad odor. ° ° °

## 2013-06-21 NOTE — Anesthesia Preprocedure Evaluation (Signed)
Anesthesia Evaluation  Patient identified by MRN, date of birth, ID band Patient awake    Reviewed: Allergy & Precautions, H&P , NPO status , Patient's Chart, lab work & pertinent test results  Airway Mallampati: II  Neck ROM: Full    Dental  (+) Teeth Intact, Dental Advisory Given   Pulmonary  breath sounds clear to auscultation        Cardiovascular hypertension, Rhythm:Regular Rate:Normal     Neuro/Psych    GI/Hepatic   Endo/Other    Renal/GU      Musculoskeletal   Abdominal   Peds  Hematology   Anesthesia Other Findings   Reproductive/Obstetrics                           Anesthesia Physical Anesthesia Plan  ASA: II  Anesthesia Plan: General   Post-op Pain Management:    Induction: Intravenous  Airway Management Planned: Oral ETT  Additional Equipment:   Intra-op Plan:   Post-operative Plan: Extubation in OR  Informed Consent: I have reviewed the patients History and Physical, chart, labs and discussed the procedure including the risks, benefits and alternatives for the proposed anesthesia with the patient or authorized representative who has indicated his/her understanding and acceptance.   Dental advisory given  Plan Discussed with: CRNA and Anesthesiologist  Anesthesia Plan Comments: (Hypertension Macromastia  Plan GA with oral ETT  Roberts Gaudy)        Anesthesia Quick Evaluation

## 2013-06-21 NOTE — Anesthesia Postprocedure Evaluation (Signed)
  Anesthesia Post-op Note  Patient: Rachael Conley  Procedure(s) Performed: Procedure(s): BILATERAL MAMMARY REDUCTION  (BREAST) (Bilateral)  Patient Location: PACU  Anesthesia Type:General  Level of Consciousness: awake, alert  and oriented  Airway and Oxygen Therapy: Patient Spontanous Breathing and Patient connected to nasal cannula oxygen  Post-op Pain: mild  Post-op Assessment: Post-op Vital signs reviewed, Patient's Cardiovascular Status Stable, Respiratory Function Stable, Patent Airway and Pain level controlled  Post-op Vital Signs: stable  Last Vitals:  Filed Vitals:   06/21/13 1413  BP: 150/91  Pulse: 90  Temp: 36.9 C  Resp: 16    Complications: No apparent anesthesia complications

## 2013-06-22 ENCOUNTER — Encounter (HOSPITAL_BASED_OUTPATIENT_CLINIC_OR_DEPARTMENT_OTHER): Payer: Self-pay | Admitting: Plastic Surgery

## 2013-08-05 NOTE — Brief Op Note (Signed)
06/21/2013  8:40 AM  PATIENT:  Rachael Conley  43 y.o. 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]  PROCEDURE:  Procedure(s): BILATERAL MAMMARY REDUCTION  (BREAST) (Bilateral)  SURGEON:  Surgeon(s) and Role:    * Mary A Contogiannis, MD - Primary  ANESTHESIA:   general  EBL:   150 ml  BLOOD ADMINISTERED:none  DRAINS: (85F) Jackson-Pratt drain(s) with closed bulb suction in the Bilateral Breasts   LOCAL MEDICATIONS USED: 1.3% Exparel (total 266 mgs.)  SPECIMEN:  Source of Specimen:  Bilateral breasts  DISPOSITION OF SPECIMEN:  PATHOLOGY  COUNTS:  YES  DICTATION: .Other Dictation: Dictation Number [id number]  PLAN OF CARE: Discharge to home after PACU  PATIENT DISPOSITION:  PACU - hemodynamically stable.   Delay start of Pharmacological VTE agent (>24hrs) due to surgical blood loss or risk of bleeding: not applicable

## 2013-08-05 NOTE — Op Note (Signed)
NAMECELISE, BAZAR NO.:  [id number]  MEDICAL RECORD NO.:  [id number]  LOCATION:                                FACILITY:  [facility]  PHYSICIAN:  Hetty Blend, M.D.DATE OF BIRTH:  Apr 04, 1970  DATE OF PROCEDURE:  06/21/2013 DATE OF DISCHARGE:  06/21/2013                              OPERATIVE REPORT   PREOPERATIVE DIAGNOSIS:  Bilateral macromastia.  POSTOPERATIVE DIAGNOSIS:  Bilateral macromastia.  PROCEDURE:  Bilateral reduction mammoplasties.  ATTENDING SURGEON:  Hetty Blend, M.D.  ANESTHESIA:  General.  ANESTHESIOLOGIST:  Glynda Jaeger, M.D.  ESTIMATED BLOOD LOSS:  150 mL.  FLUID INTAKE:  1400 mL of crystalloid.  URINE OUTPUT:  150 mL.  COMPLICATIONS:  None.  INDICATIONS FOR THE PROCEDURE:  The patient is a 43 year old female who has bilateral macromastia, that is clinically symptomatic.  She presents to undergo bilateral reduction mammoplasties.  DESCRIPTION OF PROCEDURE:  The patient was marked in the preop holding area in the pattern of Wise for the future bilateral reduction mammoplasties.  She was then taken back to the OR and placed on the table in supine position.  After adequate general anesthesia was obtained, the patient's chest was prepped with Techni-Care and draped in sterile fashion.  The bases of the breasts had been injected with 1% lidocaine with epinephrine.  After adequate hemostasis and anesthesia taken effect, the procedure was begun.  Both of the breast reductions were performed in the following similar manner.  The nipple-areolar complexes were marked at the 45-mm nipple marker.  The skin was then incised and deepithelialized around the nipple-areolar complex down to an inframammary crease in inferior pedicle pattern.  Next, the medial, superior, and lateral skin flaps were elevated down to the chest wall.  The excess fat and glandular tissue were removed from the inferior pedicle.  The nipple-areolar complex  was examined and found to be pink and viable.  The wound was irrigated with saline irrigation.  Meticulous hemostasis was obtained with the Bovie electrocautery.  A #10 JP flat, fully-fluted drain was placed into the wound.  The skin flaps brought together at the inverted T junction with a 2-0 Prolene suture.  The incision was stapled for temporary closure.  The breasts were compared and found to have good shape and symmetry.  I then proceeded to close the incisions from the medial aspect of the JP drain to the medial aspect of the [facility] incision by first placing the few 3-0 Monocryl sutures to tack together the dermal layer, and then both the dermal and cuticular layer were closed in a single layer using a 2-0 Quill PDO barbed suture.  Lateral to the JP drain, incision was closed using 3-0 Monocryl in the dermal layer, followed by 3-0 Monocryl running in the intracuticular stitch on the skin.  The vertical limb of the Wise pattern was closed in the dermal layer using 3-0 Monocryl suture.  The patient was then placed in the upright position.  The future location of the nipple-areolar complexes was marked on both breast mounds using the 45-mm nipple marker.  She was then placed back in the recumbent position.  Both of the nipple-areolar complexes were brought  out onto the breast mounds in the following similar manner.  The skin was incised as marked and removed down into the subcutaneous tissues.  The nipple-areolar complexes were examined, found to be pink and viable, then brought out through this aperture and sewn in place using 4-0 Monocryl in the dermal layer followed by 5-0 Monocryl running intracuticular stitch on the skin.  The cuticular layer of the vertical limb incision was also closed with the 5-0 Monocryl in continuity with closure of the nipple-areolar complex.  The JP drain was sewn in place using 3-0 nylon suture.  Prior to closing the breast wounds, the pectoralis major  muscle fascia, the breasts and soft tissue of the chest wall as well as now the inframammary crease incisions were all infiltrated with 1.3% Exparel (total of 266 mg) to provide postoperative anesthesia.  There were no complications.  The patient tolerated the procedure well.  The final needle and sponge counts were reported to be correct at the end of the case.  The patient was then recovered without complications.  Both the patient and her family were given proper postoperative wound care instructions including care of the JP drains.  She was then discharged to home in stable condition.  Followup appointment will be in a few days in the office.          ______________________________ Hetty Blend, M.D.     [facility]  D:  08/04/2013  T:  08/04/2013  Job:  [id number]

## 2013-11-10 ENCOUNTER — Other Ambulatory Visit: Payer: Self-pay | Admitting: Family Medicine

## 2013-12-13 ENCOUNTER — Other Ambulatory Visit: Payer: Self-pay | Admitting: Obstetrics and Gynecology

## 2013-12-16 LAB — CYTOLOGY - PAP

## 2014-01-09 ENCOUNTER — Other Ambulatory Visit: Payer: Self-pay | Admitting: Physician Assistant

## 2014-01-10 ENCOUNTER — Ambulatory Visit (INDEPENDENT_AMBULATORY_CARE_PROVIDER_SITE_OTHER): Payer: BC Managed Care – PPO | Admitting: Physician Assistant

## 2014-01-10 VITALS — BP 114/82 | HR 73 | Temp 98.3°F | Resp 16 | Ht 62.25 in | Wt 147.4 lb

## 2014-01-10 DIAGNOSIS — I10 Essential (primary) hypertension: Secondary | ICD-10-CM

## 2014-01-10 MED ORDER — HYDROCHLOROTHIAZIDE 12.5 MG PO TABS
12.5000 mg | ORAL_TABLET | Freq: Every day | ORAL | Status: AC
Start: 2014-01-10 — End: ?

## 2014-01-10 NOTE — Progress Notes (Signed)
    MRN: [id number] DOB: [birth year]  Subjective:   Rachael Conley is a 43 y.o. female presenting for refill of medication.  She takes her BP at home and finds it about SBP 120/80 or 114/80.  No chest pain, no palpitations.  Zumba 1/week.  Liturgical dance.  No dizziness, lightheadedness.  Diet: She states that she has been eating various foods without observance of weight gain or healthy choices.  She has good hydration, but often finds herself snacking a lot at work such as cookies and chips.    Patient states that she does not have a primary care provider and would like one here, but forgets to schedule a physical.  She visits with an NP at work at [employer] annually for full lab evaluation, and treadmill testing (unsure if this is an ekg).  This includes her lipid, kidney, liver, and blood work.    Rachael Conley has a current medication list which includes the following prescription(s): hydrochlorothiazide.  She is allergic to penicillins.  Rachael Conley  has a past medical history of Hypertension; Hypertrophy of breast; and Anemia. Also  has past surgical history that includes myoectomy (2007) and Breast reduction surgery (Bilateral, 06/21/2013).  ROS As in subjective.  Objective:   Vitals: BP 114/82 mmHg  Pulse 73  Temp(Src) 98.3 F (36.8 C) (Oral)  Resp 16  Ht 5' 2.25" (1.581 m)  Wt 147 lb 6.4 oz (66.86 kg)  BMI 26.75 kg/m2  SpO2 100%  LMP 12/26/2013 (Exact Date)  Physical Exam  No results found for this or any previous visit (from the past 24 hour(s)).   Assessment and Plan :  43 year old female is here today for medication refill. Patient is declining a flu and TDAP at this time.    Essential hypertension - Plan: hydrochlorothiazide (HYDRODIURIL) 12.5 MG tablet' BP is stable and well-controlled.  Kidney function, potassium, etc will be obtained from labwork at office.   Refill of the HCTZ for 3 months.  She will turn in the lab results of the lab work taken in January.  Within this  time she will have a scheduled visit for an annual physical exam before she is to get a refill.  Patient and provider have reached this plan to insure that she has a full annual visit.    Ivar Drape, PA-C [facility] 12/22/20159:35 PM

## 2014-05-31 ENCOUNTER — Other Ambulatory Visit: Payer: Self-pay

## 2014-05-31 DIAGNOSIS — Z1231 Encounter for screening mammogram for malignant neoplasm of breast: Secondary | ICD-10-CM

## 2014-06-23 ENCOUNTER — Ambulatory Visit: Payer: Self-pay

## 2014-06-28 ENCOUNTER — Ambulatory Visit
Admission: RE | Admit: 2014-06-28 | Discharge: 2014-06-28 | Disposition: A | Discharge status: Home / Self Care | Payer: BLUE CROSS/BLUE SHIELD | Source: Ambulatory Visit

## 2014-06-28 ENCOUNTER — Encounter (INDEPENDENT_AMBULATORY_CARE_PROVIDER_SITE_OTHER): Payer: Self-pay

## 2014-06-28 DIAGNOSIS — Z1231 Encounter for screening mammogram for malignant neoplasm of breast: Secondary | ICD-10-CM

## 2015-06-27 ENCOUNTER — Other Ambulatory Visit: Payer: Self-pay | Admitting: Obstetrics and Gynecology

## 2015-06-27 DIAGNOSIS — Z1231 Encounter for screening mammogram for malignant neoplasm of breast: Secondary | ICD-10-CM

## 2015-07-09 ENCOUNTER — Ambulatory Visit
Admission: RE | Admit: 2015-07-09 | Discharge: 2015-07-09 | Disposition: A | Discharge status: Home / Self Care | Payer: BLUE CROSS/BLUE SHIELD | Source: Ambulatory Visit | Attending: Obstetrics and Gynecology | Admitting: Obstetrics and Gynecology

## 2015-07-09 DIAGNOSIS — Z1231 Encounter for screening mammogram for malignant neoplasm of breast: Secondary | ICD-10-CM

## 2016-01-08 ENCOUNTER — Other Ambulatory Visit: Payer: Self-pay | Admitting: Obstetrics and Gynecology

## 2016-01-08 DIAGNOSIS — Z9889 Other specified postprocedural states: Secondary | ICD-10-CM

## 2016-01-08 DIAGNOSIS — N6310 Unspecified lump in the right breast, unspecified quadrant: Secondary | ICD-10-CM

## 2016-01-17 ENCOUNTER — Ambulatory Visit
Admission: RE | Admit: 2016-01-17 | Discharge: 2016-01-17 | Disposition: A | Discharge status: Home / Self Care | Payer: BLUE CROSS/BLUE SHIELD | Source: Ambulatory Visit | Attending: Obstetrics and Gynecology | Admitting: Obstetrics and Gynecology

## 2016-01-17 DIAGNOSIS — N6310 Unspecified lump in the right breast, unspecified quadrant: Secondary | ICD-10-CM

## 2016-01-17 DIAGNOSIS — Z9889 Other specified postprocedural states: Secondary | ICD-10-CM

## 2016-09-29 NOTE — H&P (Addendum)
Rachael Conley is an 46 y.o. female presents for definitive management of fibroids and menorrhagia.    Past Medical History:  Diagnosis Date  . Anemia    resolved now  . Hypertension   . Hypertrophy of breast     Past Surgical History:  Procedure Laterality Date  . BREAST REDUCTION SURGERY Bilateral 06/21/2013   Procedure: BILATERAL MAMMARY REDUCTION  (BREAST);  Surgeon: Charlene Brooke, MD;  Location: [place];  Service: Plastics;  Laterality: Bilateral;  . myoectomy  2007    Family History  Problem Relation Age of Onset  . Heart disease Mother   . Hypertension Mother   . Hypertension Father   . Hyperlipidemia Father   . Hypertension Sister   . Diabetes Sister     Social History:  reports that she has never smoked. She has never used smokeless tobacco. She reports that she drinks alcohol. She reports that she does not use drugs.  Allergies:  Allergies  Allergen Reactions  . Penicillins Hives    Meds:  HCTZ, OCPs  ROS  Physical Exam  Gen  - NAD CV - RRR Lungs - clear Abd - soft, palpable uterine fibroids, NT Ext - NT, no edema PV - uterus 18 wk size, NT  [place]:  Multiple fibroids 8.2, 4.0, 4.7, 3.0, 2.7, 2.4 and 1.7 cm.   Assessment/Plan:  Menorrhagia, fibroids Pt requests definitive management with hysterectomy.  H/o multiple myomectomies w/out improvement of her sx.  Rec TAH/BS R/b/a discussed, questions answered, informed consent [place] Svehla 09/29/2016, 1:23 PM

## 2016-10-15 ENCOUNTER — Encounter (HOSPITAL_COMMUNITY): Payer: Self-pay

## 2016-10-15 ENCOUNTER — Other Ambulatory Visit: Payer: Self-pay

## 2016-10-15 ENCOUNTER — Encounter (HOSPITAL_COMMUNITY)
Admission: RE | Admit: 2016-10-15 | Discharge: 2016-10-15 | Disposition: A | Discharge status: Home / Self Care | Payer: BLUE CROSS/BLUE SHIELD | Source: Ambulatory Visit | Attending: Obstetrics and Gynecology | Admitting: Obstetrics and Gynecology

## 2016-10-15 DIAGNOSIS — D259 Leiomyoma of uterus, unspecified: Secondary | ICD-10-CM | POA: Insufficient documentation

## 2016-10-15 DIAGNOSIS — N920 Excessive and frequent menstruation with regular cycle: Secondary | ICD-10-CM | POA: Insufficient documentation

## 2016-10-15 DIAGNOSIS — Z01818 Encounter for other preprocedural examination: Secondary | ICD-10-CM | POA: Insufficient documentation

## 2016-10-15 HISTORY — DX: Other specified postprocedural states: Z98.890

## 2016-10-15 HISTORY — DX: Nausea with vomiting, unspecified: R11.2

## 2016-10-15 LAB — COMPREHENSIVE METABOLIC PANEL
ALBUMIN: 4.3 g/dL (ref 3.5–5.0)
ALK PHOS: 49 U/L (ref 38–126)
ALT: 14 U/L (ref 14–54)
AST: 22 U/L (ref 15–41)
Anion gap: 6 (ref 5–15)
BILIRUBIN TOTAL: 1 mg/dL (ref 0.3–1.2)
BUN: 12 mg/dL (ref 6–20)
CALCIUM: 9.2 mg/dL (ref 8.9–10.3)
CO2: 28 mmol/L (ref 22–32)
Chloride: 103 mmol/L (ref 101–111)
Creatinine, Ser: 0.94 mg/dL (ref 0.44–1.00)
GFR calc Af Amer: >60 mL/min (ref >60)
GFR calc non Af Amer: >60 mL/min (ref >60)
GLUCOSE: 96 mg/dL (ref 65–99)
Potassium: 3.1 mmol/L — ABNORMAL LOW (ref 3.5–5.1)
SODIUM: 137 mmol/L (ref 135–145)
Total Protein: 7.7 g/dL (ref 6.5–8.1)

## 2016-10-15 LAB — TYPE AND SCREEN
ABO/RH(D): O POS
ANTIBODY SCREEN: NEGATIVE

## 2016-10-15 LAB — CBC
HEMATOCRIT: 38.7 % (ref 36.0–46.0)
HEMOGLOBIN: 12.5 g/dL (ref 12.0–15.0)
MCH: 28.2 pg (ref 26.0–34.0)
MCHC: 32.3 g/dL (ref 30.0–36.0)
MCV: 87.4 fL (ref 78.0–100.0)
Platelets: 232 10*3/uL (ref 150–400)
RBC: 4.43 MIL/uL (ref 3.87–5.11)
RDW: 14.4 % (ref 11.5–15.5)
WBC: 6.4 10*3/uL (ref 4.0–10.5)

## 2016-10-15 NOTE — Pre-Procedure Instructions (Signed)
Dr. Sabra Heck reviewed EKG no new orders received at this time.

## 2016-10-15 NOTE — Patient Instructions (Signed)
Your procedure is scheduled on:  Thursday, Oct. 4, 2018  Enter through the [employer] [facility] at: 6:00 AM  Pick up the phone at the desk and dial [phone].  Call this number if you have problems the morning of surgery: [phone].  Remember: Do NOT eat food or drink after:  Midnight Wednesday  Take these medicines the morning of surgery with a SIP OF WATER:  None  Stop ALL herbal medications at this time  Do NOT smoke the day of surgery.  Do NOT wear jewelry (body piercing), metal hair clips/bobby pins, make-up, artifical eyelashes or nail polish. Do NOT wear lotions, powders, or perfumes.  You may wear deodorant. Do NOT shave for 48 hours prior to surgery. Do NOT bring valuables to the hospital. Contacts, dentures, or bridgework may not be worn into surgery.  Leave suitcase in car.  After surgery it may be brought to your room.  For patients admitted to the hospital, checkout time is 11:00 AM the day of discharge.  Bring a copy of your healthcare power of attorney and living will documents.

## 2016-10-22 NOTE — Anesthesia Preprocedure Evaluation (Addendum)
Anesthesia Evaluation  Patient identified by MRN, date of birth, ID band Patient awake    Reviewed: Allergy & Precautions, NPO status , Patient's Chart, lab work & pertinent test results  History of Anesthesia Complications (+) PONV and history of anesthetic complications  Airway Mallampati: II  TM Distance: >3 FB Neck ROM: Full    Dental  (+) Teeth Intact, Dental Advisory Given   Pulmonary neg pulmonary ROS,    breath sounds clear to auscultation       Cardiovascular hypertension,  Rhythm:Regular Rate:Normal     Neuro/Psych negative neurological ROS     GI/Hepatic negative GI ROS, Neg liver ROS,   Endo/Other  negative endocrine ROS  Renal/GU negative Renal ROS     Musculoskeletal negative musculoskeletal ROS (+)   Abdominal   Peds  Hematology negative hematology ROS (+)   Anesthesia Other Findings Day of surgery medications reviewed with the patient.  Reproductive/Obstetrics                            Lab Results  Component Value Date   WBC 6.4 10/15/2016   HGB 12.5 10/15/2016   HCT 38.7 10/15/2016   MCV 87.4 10/15/2016   PLT 232 10/15/2016   Lab Results  Component Value Date   CREATININE 0.94 10/15/2016   BUN 12 10/15/2016   NA 137 10/15/2016   K 3.1 (L) 10/15/2016   CL 103 10/15/2016   CO2 28 10/15/2016   No results found for: INR, PROTIME  EKG: normal sinus rhythm.  Anesthesia Physical Anesthesia Plan  ASA: II  Anesthesia Plan: General   Post-op Pain Management:    Induction: Intravenous  PONV Risk Score and Plan: 4 or greater and Ondansetron, Dexamethasone, Midazolam, Scopolamine patch - Pre-op and Treatment may vary due to age or medical condition  Airway Management Planned: Oral ETT  Additional Equipment:   Intra-op Plan:   Post-operative Plan: Extubation in OR  Informed Consent: I have reviewed the patients History and Physical, chart, labs and  discussed the procedure including the risks, benefits and alternatives for the proposed anesthesia with the patient or authorized representative who has indicated his/her understanding and acceptance.   Dental advisory given  Plan Discussed with: CRNA  Anesthesia Plan Comments:        Anesthesia Quick Evaluation

## 2016-10-23 ENCOUNTER — Inpatient Hospital Stay (HOSPITAL_COMMUNITY)
Admission: AD | Admit: 2016-10-23 | Discharge: 2016-10-24 | DRG: 743 | Disposition: A | Discharge status: Home / Self Care | Payer: BLUE CROSS/BLUE SHIELD | Source: Ambulatory Visit | Attending: Obstetrics and Gynecology | Admitting: Obstetrics and Gynecology

## 2016-10-23 ENCOUNTER — Encounter (HOSPITAL_COMMUNITY)
Admission: AD | Disposition: A | Discharge status: Home / Self Care | Payer: Self-pay | Source: Ambulatory Visit | Attending: Obstetrics and Gynecology

## 2016-10-23 ENCOUNTER — Inpatient Hospital Stay (HOSPITAL_COMMUNITY): Payer: BLUE CROSS/BLUE SHIELD | Admitting: Anesthesiology

## 2016-10-23 ENCOUNTER — Encounter (HOSPITAL_COMMUNITY): Payer: Self-pay

## 2016-10-23 DIAGNOSIS — I10 Essential (primary) hypertension: Secondary | ICD-10-CM | POA: Diagnosis present

## 2016-10-23 DIAGNOSIS — D259 Leiomyoma of uterus, unspecified: Principal | ICD-10-CM | POA: Diagnosis present

## 2016-10-23 DIAGNOSIS — N920 Excessive and frequent menstruation with regular cycle: Secondary | ICD-10-CM | POA: Diagnosis present

## 2016-10-23 DIAGNOSIS — Z880 Allergy status to penicillin: Secondary | ICD-10-CM | POA: Diagnosis not present

## 2016-10-23 DIAGNOSIS — D219 Benign neoplasm of connective and other soft tissue, unspecified: Secondary | ICD-10-CM | POA: Diagnosis present

## 2016-10-23 HISTORY — PX: HYSTERECTOMY ABDOMINAL WITH SALPINGECTOMY: SHX6725

## 2016-10-23 LAB — PREGNANCY, URINE: PREG TEST UR: NEGATIVE

## 2016-10-23 SURGERY — HYSTERECTOMY, TOTAL, ABDOMINAL, WITH SALPINGECTOMY
Anesthesia: General | Laterality: Bilateral

## 2016-10-23 MED ORDER — FENTANYL CITRATE (PF) 100 MCG/2ML IJ SOLN
INTRAMUSCULAR | Status: DC | PRN
  Administered 2016-10-23: 100 ug via INTRAVENOUS
  Administered 2016-10-23: 50 ug via INTRAVENOUS
  Administered 2016-10-23: 100 ug via INTRAVENOUS

## 2016-10-23 MED ORDER — ONDANSETRON HCL 4 MG PO TABS: 8.0000 mg | ORAL_TABLET | Freq: Four times a day (QID) | ORAL | Status: DC | PRN

## 2016-10-23 MED ORDER — ONDANSETRON HCL 4 MG/2ML IJ SOLN
INTRAMUSCULAR | Status: AC
  Filled 2016-10-23: qty 2

## 2016-10-23 MED ORDER — SODIUM CHLORIDE 0.9 % IV SOLN
8.0000 mg | Freq: Four times a day (QID) | INTRAVENOUS | Status: DC | PRN
  Filled 2016-10-23: qty 4

## 2016-10-23 MED ORDER — HYDROMORPHONE HCL 1 MG/ML IJ SOLN
INTRAMUSCULAR | Status: AC
  Filled 2016-10-23: qty 0.5

## 2016-10-23 MED ORDER — LIDOCAINE HCL 1 % IJ SOLN
INTRAMUSCULAR | Status: AC
  Filled 2016-10-23: qty 1

## 2016-10-23 MED ORDER — HYDROMORPHONE HCL 1 MG/ML IJ SOLN
INTRAMUSCULAR | Status: AC
  Administered 2016-10-23: 0.5 mg via INTRAVENOUS
  Filled 2016-10-23: qty 0.5

## 2016-10-23 MED ORDER — HYDRALAZINE HCL 20 MG/ML IJ SOLN
5.0000 mg | INTRAMUSCULAR | Status: AC | PRN
  Administered 2016-10-23 (×2): 5 mg via INTRAVENOUS

## 2016-10-23 MED ORDER — ONDANSETRON HCL 4 MG PO TABS: 4.0000 mg | ORAL_TABLET | Freq: Four times a day (QID) | ORAL | Status: DC | PRN

## 2016-10-23 MED ORDER — ONDANSETRON HCL 4 MG/2ML IJ SOLN
4.0000 mg | Freq: Four times a day (QID) | INTRAMUSCULAR | Status: DC | PRN
  Administered 2016-10-23: 4 mg via INTRAVENOUS
  Filled 2016-10-23: qty 2

## 2016-10-23 MED ORDER — SCOPOLAMINE 1 MG/3DAYS TD PT72
MEDICATED_PATCH | TRANSDERMAL | Status: AC
  Administered 2016-10-23: 1.5 mg via TRANSDERMAL
  Filled 2016-10-23: qty 1

## 2016-10-23 MED ORDER — PROPOFOL 10 MG/ML IV BOLUS
INTRAVENOUS | Status: AC
  Filled 2016-10-23: qty 20

## 2016-10-23 MED ORDER — MIDAZOLAM HCL 2 MG/2ML IJ SOLN
INTRAMUSCULAR | Status: DC | PRN
  Administered 2016-10-23: 2 mg via INTRAVENOUS

## 2016-10-23 MED ORDER — PROPOFOL 10 MG/ML IV BOLUS
INTRAVENOUS | Status: DC | PRN
  Administered 2016-10-23: 150 mg via INTRAVENOUS

## 2016-10-23 MED ORDER — LACTATED RINGERS IV SOLN
INTRAVENOUS | Status: DC
  Administered 2016-10-23 (×2): via INTRAVENOUS

## 2016-10-23 MED ORDER — FENTANYL CITRATE (PF) 250 MCG/5ML IJ SOLN
INTRAMUSCULAR | Status: AC
  Filled 2016-10-23: qty 5

## 2016-10-23 MED ORDER — PROMETHAZINE HCL 25 MG/ML IJ SOLN: 12.5000 mg | Freq: Four times a day (QID) | INTRAMUSCULAR | Status: DC | PRN

## 2016-10-23 MED ORDER — ROCURONIUM BROMIDE 100 MG/10ML IV SOLN
INTRAVENOUS | Status: DC | PRN
  Administered 2016-10-23: 50 mg via INTRAVENOUS

## 2016-10-23 MED ORDER — ONDANSETRON HCL 4 MG/2ML IJ SOLN
INTRAMUSCULAR | Status: DC | PRN
  Administered 2016-10-23: 4 mg via INTRAVENOUS

## 2016-10-23 MED ORDER — LIDOCAINE HCL (CARDIAC) 20 MG/ML IV SOLN
INTRAVENOUS | Status: DC | PRN
  Administered 2016-10-23: 50 mg via INTRAVENOUS

## 2016-10-23 MED ORDER — MIDAZOLAM HCL 2 MG/2ML IJ SOLN
INTRAMUSCULAR | Status: AC
  Filled 2016-10-23: qty 2

## 2016-10-23 MED ORDER — ROCURONIUM BROMIDE 100 MG/10ML IV SOLN
INTRAVENOUS | Status: AC
  Filled 2016-10-23: qty 1

## 2016-10-23 MED ORDER — KETOROLAC TROMETHAMINE 30 MG/ML IJ SOLN
INTRAMUSCULAR | Status: AC
  Filled 2016-10-23: qty 1

## 2016-10-23 MED ORDER — DEXAMETHASONE SODIUM PHOSPHATE 10 MG/ML IJ SOLN
INTRAMUSCULAR | Status: DC | PRN
  Administered 2016-10-23: 4 mg via INTRAVENOUS

## 2016-10-23 MED ORDER — MEPERIDINE HCL 25 MG/ML IJ SOLN: 6.2500 mg | INTRAMUSCULAR | Status: DC | PRN

## 2016-10-23 MED ORDER — HYDROMORPHONE HCL 1 MG/ML IJ SOLN
1.0000 mg | INTRAMUSCULAR | Status: DC | PRN
  Administered 2016-10-23 (×3): 1 mg via INTRAVENOUS
  Filled 2016-10-23 (×3): qty 1

## 2016-10-23 MED ORDER — LIDOCAINE HCL (CARDIAC) 20 MG/ML IV SOLN
INTRAVENOUS | Status: AC
  Filled 2016-10-23: qty 5

## 2016-10-23 MED ORDER — SUGAMMADEX SODIUM 200 MG/2ML IV SOLN
INTRAVENOUS | Status: DC | PRN
  Administered 2016-10-23: 200 mg via INTRAVENOUS

## 2016-10-23 MED ORDER — HYDROMORPHONE HCL 1 MG/ML IJ SOLN
INTRAMUSCULAR | Status: AC
  Filled 2016-10-23: qty 1

## 2016-10-23 MED ORDER — SCOPOLAMINE 1 MG/3DAYS TD PT72
1.0000 | MEDICATED_PATCH | Freq: Once | TRANSDERMAL | Status: DC
  Administered 2016-10-23: 1.5 mg via TRANSDERMAL

## 2016-10-23 MED ORDER — DEXTROSE IN LACTATED RINGERS 5 % IV SOLN
INTRAVENOUS | Status: DC
Start: 2016-10-23 — End: 2016-10-24
  Administered 2016-10-23 (×2): via INTRAVENOUS

## 2016-10-23 MED ORDER — KETOROLAC TROMETHAMINE 30 MG/ML IJ SOLN
30.0000 mg | Freq: Three times a day (TID) | INTRAMUSCULAR | Status: AC
  Administered 2016-10-23 (×3): 30 mg via INTRAVENOUS
  Filled 2016-10-23 (×2): qty 1

## 2016-10-23 MED ORDER — HYDROMORPHONE HCL 1 MG/ML IJ SOLN
0.2500 mg | INTRAMUSCULAR | Status: DC | PRN
  Administered 2016-10-23 (×4): 0.5 mg via INTRAVENOUS

## 2016-10-23 MED ORDER — OXYCODONE-ACETAMINOPHEN 5-325 MG PO TABS
1.0000 | ORAL_TABLET | ORAL | Status: DC | PRN
  Administered 2016-10-24: 2 via ORAL
  Filled 2016-10-23: qty 2

## 2016-10-23 MED ORDER — SUGAMMADEX SODIUM 200 MG/2ML IV SOLN
INTRAVENOUS | Status: AC
Start: 2016-10-23 — End: 2016-10-23
  Filled 2016-10-23: qty 2

## 2016-10-23 MED ORDER — HYDRALAZINE HCL 20 MG/ML IJ SOLN
INTRAMUSCULAR | Status: AC
  Administered 2016-10-23: 5 mg via INTRAVENOUS
  Filled 2016-10-23: qty 1

## 2016-10-23 MED ORDER — PROMETHAZINE HCL 25 MG/ML IJ SOLN: 6.2500 mg | INTRAMUSCULAR | Status: DC | PRN

## 2016-10-23 MED ORDER — GENTAMICIN SULFATE 40 MG/ML IJ SOLN
INTRAMUSCULAR | Status: AC
  Administered 2016-10-23: 113.25 mL via INTRAVENOUS
  Filled 2016-10-23: qty 7.25

## 2016-10-23 MED ORDER — DEXAMETHASONE SODIUM PHOSPHATE 4 MG/ML IJ SOLN
INTRAMUSCULAR | Status: AC
  Filled 2016-10-23: qty 1

## 2016-10-23 MED ORDER — MENTHOL 3 MG MT LOZG: 1.0000 | LOZENGE | OROMUCOSAL | Status: DC | PRN

## 2016-10-23 MED ORDER — SODIUM CHLORIDE 0.9 % IR SOLN
Status: DC | PRN
  Administered 2016-10-23: 1000 mL

## 2016-10-23 MED ORDER — LACTATED RINGERS IV SOLN: INTRAVENOUS | Status: DC

## 2016-10-23 SURGICAL SUPPLY — 34 items
ADH SKN CLS APL DERMABOND .7 (GAUZE/BANDAGES/DRESSINGS) ×1
CANISTER SUCT 3000ML PPV (MISCELLANEOUS) ×3 IMPLANT
CLOTH BEACON ORANGE TIMEOUT ST (SAFETY) ×3 IMPLANT
CONT PATH 16OZ SNAP LID 3702 (MISCELLANEOUS) ×3 IMPLANT
DECANTER SPIKE VIAL GLASS SM (MISCELLANEOUS) IMPLANT
DERMABOND ADVANCED (GAUZE/BANDAGES/DRESSINGS) ×2
DERMABOND ADVANCED .7 DNX12 (GAUZE/BANDAGES/DRESSINGS) IMPLANT
DRAPE CESAREAN BIRTH W POUCH (DRAPES) ×3 IMPLANT
DRSG OPSITE POSTOP 4X10 (GAUZE/BANDAGES/DRESSINGS) ×3 IMPLANT
DURAPREP 26ML APPLICATOR (WOUND CARE) ×3 IMPLANT
GAUZE SPONGE 4X4 16PLY XRAY LF (GAUZE/BANDAGES/DRESSINGS) ×2 IMPLANT
GLOVE BIO SURGEON STRL SZ 6.5 (GLOVE) ×2 IMPLANT
GLOVE BIO SURGEONS STRL SZ 6.5 (GLOVE) ×1
GLOVE BIOGEL PI IND STRL 7.0 (GLOVE) ×4 IMPLANT
GLOVE BIOGEL PI INDICATOR 7.0 (GLOVE) ×8
GOWN STRL REUS W/TWL LRG LVL3 (GOWN DISPOSABLE) ×9 IMPLANT
HEMOSTAT SURGICEL 4X8 (HEMOSTASIS) IMPLANT
NS IRRIG 1000ML POUR BTL (IV SOLUTION) ×3 IMPLANT
PACK ABDOMINAL GYN (CUSTOM PROCEDURE TRAY) ×3 IMPLANT
PAD OB MATERNITY 4.3X12.25 (PERSONAL CARE ITEMS) ×3 IMPLANT
PROTECTOR NERVE ULNAR (MISCELLANEOUS) ×6 IMPLANT
SPONGE LAP 18X18 X RAY DECT (DISPOSABLE) ×6 IMPLANT
STAPLER VISISTAT 35W (STAPLE) IMPLANT
SUT MNCRL 0 MO-4 VIOLET 18 CR (SUTURE) ×3 IMPLANT
SUT MNCRL 0 VIOLET 6X18 (SUTURE) ×1 IMPLANT
SUT MON AB-0 CT1 36 (SUTURE) ×3 IMPLANT
SUT MONOCRYL 0 6X18 (SUTURE) ×2
SUT MONOCRYL 0 MO 4 18  CR/8 (SUTURE) ×4
SUT PDS AB 0 CTX 60 (SUTURE) ×3 IMPLANT
SUT PLAIN 2 0 XLH (SUTURE) ×2 IMPLANT
SUT VIC AB 3-0 X1 27 (SUTURE) IMPLANT
SUT VIC AB 4-0 KS 27 (SUTURE) IMPLANT
TOWEL OR 17X24 6PK STRL BLUE (TOWEL DISPOSABLE) ×6 IMPLANT
TRAY FOLEY CATH SILVER 14FR (SET/KITS/TRAYS/PACK) ×3 IMPLANT

## 2016-10-23 NOTE — Op Note (Signed)
Hysterectomy Procedure Note  Indications: fibroids, menorrhagia  Pre-operative Diagnosis: same  Post-operative Diagnosis: same  Operation: Total abdominal hysterectomy, bilateral salpingo  Surgeon: Ayaan Shutes   Assistants: J. Gaetano Net, MD  Anesthesia: General endotracheal anesthesia   Procedure Details  The patient was seen in the Holding Room. The risks, benefits, complications, treatment options, and expected outcomes were discussed with the patient.  The patient concurred with the proposed plan, giving informed consent.  The site of surgery properly noted/marked. The patient was taken to Operating Room # 4, identified as Rachael Conley and the procedure verified as Total abdominal hysterectomy & bilateral salpingectomy. A Time Out was held and the above information confirmed.  After induction of anesthesia, the patient was draped and prepped in the usual sterile manner. Pt was placed in supine position after anesthesia and draped and prepped in the usual sterile manner. Foley catheter was placed.  A transverse incision was made and carried through the subcutaneous tissue to the fascia. Fascial incision was made and extended bilaterally. The rectus muscles were separated. The peritoneum was identified and entered. Peritoneal incision was extended longitudinally.  The round ligaments were identified, cut, and ligated with 0-monocryl. The anterior peritoneal reflection was incised and the bladder was dissected off the lower uterine segment. The retroperitoneal space was explored and the ureters were identified bilaterally.  Hemostasis  was observed. The uterine vessels were skeletonized, then clamped, cut and suture ligated with 0-monocryl suture. Serial pedicles of the cardinal and utero-sacral ligaments were clamped, cut, and suture ligated with 0-monocryl. Entrance was made into the vagina and the uterus removed. Vaginal cuff angle sutures were placed incorporating the utero-sacral  ligaments for support. The vaginal cuff was then closed with a running stitch of 0-monocryl. Lavage was carried out until clear. Hemostasis was observed.  Bilateral fallopian tubes were grasped with babcock and clamped with a kelly.  Fimbriated ends excised and doubly suture ligated.  Hemostasis noted.  All packing was removed from the abdomen. The fascia was approximated with running sutures of 0-PDS. Lavage was again carried out. Hemostasis was observed. The skin was approximated with 4-0 vicryl.  Instrument, sponge, and needle counts were correct prior to abdominal closure and at the conclusion of the case.   Estimated Blood Loss:  less than 100 mL         Specimens: uterus with segments of bilteral            Complications:  None; patient tolerated the procedure well.         Disposition: PACU - hemodynamically stable.         Condition: stable  Attending Attestation: I was present and scrubbed for the entire procedure.

## 2016-10-23 NOTE — Transfer of Care (Signed)
Immediate Anesthesia Transfer of Care Note  Patient: Rachael Conley  Procedure(s) Performed: HYSTERECTOMY ABDOMINAL WITH SALPINGECTOMY (Bilateral )  Patient Location: PACU  Anesthesia Type:General  Level of Consciousness: awake, alert  and oriented  Airway & Oxygen Therapy: Patient Spontanous Breathing and Patient connected to nasal cannula oxygen  Post-op Assessment: Report given to RN and Post -op Vital signs reviewed and stable  Post vital signs: Reviewed and stable  Last Vitals:  Vitals:   10/23/16 0604  BP: (!) 158/95  Pulse: 60  Resp: 18  Temp: 36.9 C  SpO2: 99%    Last Pain:  Vitals:   10/23/16 0604  TempSrc: Oral      Patients Stated Pain Goal: 3 (43/15/40 0867)  Complications: No apparent anesthesia complications

## 2016-10-23 NOTE — Anesthesia Postprocedure Evaluation (Signed)
Anesthesia Post Note  Patient: Rachael Conley  Procedure(s) Performed: HYSTERECTOMY ABDOMINAL WITH SALPINGECTOMY (Bilateral )     Patient location during evaluation: PACU Anesthesia Type: General Level of consciousness: awake and alert Pain management: pain level controlled Vital Signs Assessment: post-procedure vital signs reviewed and stable Respiratory status: spontaneous breathing, nonlabored ventilation, respiratory function stable and patient connected to nasal cannula oxygen Cardiovascular status: blood pressure returned to baseline and stable Postop Assessment: no apparent nausea or vomiting Anesthetic complications: no    Last Vitals:  Vitals:   10/23/16 1000 10/23/16 1027  BP: 135/83 135/72  Pulse: 82 83  Resp: 12 14  Temp: 36.9 C 37 C  SpO2: 100% 100%    Pain Goal: Patients Stated Pain Goal: 3 (10/23/16 1030)               Effie Berkshire

## 2016-10-23 NOTE — Anesthesia Procedure Notes (Signed)
Procedure Name: Intubation Date/Time: 10/23/2016 7:31 AM Performed by: Jonna Munro Pre-anesthesia Checklist: Patient identified, Emergency Drugs available, Suction available, Patient being monitored and Timeout performed Patient Re-evaluated:Patient Re-evaluated prior to induction Oxygen Delivery Method: Circle system utilized Preoxygenation: Pre-oxygenation with 100% oxygen Induction Type: IV induction Ventilation: Mask ventilation without difficulty Laryngoscope Size: Mac and 3 Grade View: Grade I Tube type: Oral Tube size: 7.0 mm Number of attempts: 1 Airway Equipment and Method: Stylet Placement Confirmation: ETT inserted through vocal cords under direct vision,  positive ETCO2 and breath sounds checked- equal and bilateral Secured at: 22 cm Tube secured with: Tape Dental Injury: Teeth and Oropharynx as per pre-operative assessment

## 2016-10-24 ENCOUNTER — Encounter (HOSPITAL_COMMUNITY): Payer: Self-pay | Admitting: Obstetrics and Gynecology

## 2016-10-24 LAB — CBC
HCT: 31.3 % — ABNORMAL LOW (ref 36.0–46.0)
HEMOGLOBIN: 10.4 g/dL — AB (ref 12.0–15.0)
MCH: 28.7 pg (ref 26.0–34.0)
MCHC: 33.2 g/dL (ref 30.0–36.0)
MCV: 86.5 fL (ref 78.0–100.0)
Platelets: 200 10*3/uL (ref 150–400)
RBC: 3.62 MIL/uL — ABNORMAL LOW (ref 3.87–5.11)
RDW: 14.4 % (ref 11.5–15.5)
WBC: 12 10*3/uL — ABNORMAL HIGH (ref 4.0–10.5)

## 2016-10-24 MED ORDER — IBUPROFEN 600 MG PO TABS: 600.0000 mg | ORAL_TABLET | Freq: Four times a day (QID) | ORAL | 0 refills | Status: AC | PRN

## 2016-10-24 MED ORDER — OXYCODONE-ACETAMINOPHEN 5-325 MG PO TABS: 1.0000 | ORAL_TABLET | ORAL | 0 refills | Status: AC | PRN

## 2016-10-24 NOTE — Discharge Summary (Signed)
Physician Discharge Summary  Patient ID: NIOMIE ENGLERT MRN: [id number] DOB/AGE: 46-May-1972 46 y.o.  Admit date: 10/23/2016 Discharge date: 10/24/2016  Admission Diagnoses:  Fibroids, menorrhagia  Discharge Diagnoses:  Active Problems:   Fibroids   Discharged Condition: good  Hospital Course: pt admitted for postop care.  Initially pain controlled with IV meds but as she was able to tolerate PO intake, PO meds were given.  Foley catheter was removed and she was able to ambulate and void without problems.  POD0, she had an episode of n/v after eating fried chicken.  This resolved with IV zofran and she has been tolerating PO without problems today.  Vitals and labs stable.  Pt desires discharge  Consults: None  Significant Diagnostic Studies: labs: cbc  Treatments: surgery: TAH/BS  Discharge Exam: Blood pressure 131/60, pulse (!) 59, temperature 98.4 F (36.9 C), temperature source Oral, resp. rate 16, height 5' 2.75" (1.594 m), weight 146 lb (66.2 kg), SpO2 100 %. General appearance: alert and cooperative GI: normal findings: soft, non-tender Incision/Wound:c/d/i  Disposition: 01-Home or Self Care  Discharge Instructions    Diet - low sodium heart healthy    Complete by:  As directed    Increase activity slowly    Complete by:  As directed      Allergies as of 10/24/2016      Reactions   Penicillins Hives   Has patient had a PCN reaction causing immediate rash, facial/tongue/throat swelling, SOB or lightheadedness with hypotension: Yes Has patient had a PCN reaction causing severe rash involving mucus membranes or skin necrosis: No Has patient had a PCN reaction that required hospitalization: No Has patient had a PCN reaction occurring within the last 10 years: Unknown If all of the above answers are "NO", then may proceed with Cephalosporin use.      Medication List    STOP taking these medications   norethindrone 0.35 MG tablet Commonly known as:   MICRONOR,CAMILA,ERRIN     TAKE these medications   hydrochlorothiazide 12.5 MG tablet Commonly known as:  HYDRODIURIL Take 1 tablet (12.5 mg total) by mouth daily. NEEDS OFFICE VISIT   ibuprofen 600 MG tablet Commonly known as:  ADVIL,MOTRIN Take 1 tablet (600 mg total) by mouth every 6 (six) hours as needed. What changed:  medication strength  how much to take  reasons to take this   oxyCODONE-acetaminophen 5-325 MG tablet Commonly known as:  PERCOCET/ROXICET Take 1-2 tablets by mouth every 4 (four) hours as needed for severe pain (moderate to severe pain (when tolerating fluids)).      Follow-up Information    Marylynn Pearson, MD. Schedule an appointment as soon as possible for a visit in 2 week(s).   Specialty:  Obstetrics and Gynecology Contact information: [place], [place] [address] [phone]           Signed: Kailly Richoux 10/24/2016, 8:10 AM

## 2016-10-24 NOTE — Progress Notes (Signed)
Discharge instructions given to patient and she verbalized understanding of all instructions given. Rx given for Percocet along with written copy of AVS.

## 2016-10-24 NOTE — Addendum Note (Signed)
Addendum  created 10/24/16 0811 by Garner Nash, CRNA   Sign clinical note

## 2016-10-24 NOTE — Anesthesia Postprocedure Evaluation (Signed)
Anesthesia Post Note  Patient: Rachael Conley  Procedure(s) Performed: HYSTERECTOMY ABDOMINAL WITH SALPINGECTOMY (Bilateral )     Patient location during evaluation: [facility] Anesthesia Type: General Level of consciousness: awake and alert Pain management: satisfactory to patient Vital Signs Assessment: post-procedure vital signs reviewed and stable Respiratory status: spontaneous breathing Cardiovascular status: stable Postop Assessment: no headache, no apparent nausea or vomiting and adequate PO intake Anesthetic complications: no Comments: Pain score 3.    Last Vitals:  Vitals:   10/23/16 2358 10/24/16 0405  BP: (!) 105/51 131/60  Pulse: (!) 57 (!) 59  Resp: 16 16  Temp: 36.8 C 36.9 C  SpO2: 100% 100%    Last Pain:  Vitals:   10/24/16 0733  TempSrc:   PainSc: 4    Pain Goal: Patients Stated Pain Goal: 3 (10/23/16 1843)               Rico Sheehan

## 2016-10-24 NOTE — Progress Notes (Signed)
Discharged home ambulatory in stable condition.

## 2016-11-20 ENCOUNTER — Other Ambulatory Visit: Payer: Self-pay | Admitting: Obstetrics and Gynecology

## 2016-11-20 ENCOUNTER — Ambulatory Visit
Admission: RE | Admit: 2016-11-20 | Discharge: 2016-11-20 | Disposition: A | Discharge status: Home / Self Care | Payer: BLUE CROSS/BLUE SHIELD | Source: Ambulatory Visit | Attending: Obstetrics and Gynecology | Admitting: Obstetrics and Gynecology

## 2016-11-20 DIAGNOSIS — Z1231 Encounter for screening mammogram for malignant neoplasm of breast: Secondary | ICD-10-CM

## 2017-12-09 ENCOUNTER — Other Ambulatory Visit: Payer: Self-pay | Admitting: Obstetrics and Gynecology

## 2017-12-09 DIAGNOSIS — Z1231 Encounter for screening mammogram for malignant neoplasm of breast: Secondary | ICD-10-CM

## 2018-01-21 ENCOUNTER — Ambulatory Visit
Admission: RE | Admit: 2018-01-21 | Discharge: 2018-01-21 | Disposition: A | Discharge status: Home / Self Care | Payer: BLUE CROSS/BLUE SHIELD | Source: Ambulatory Visit | Attending: Obstetrics and Gynecology | Admitting: Obstetrics and Gynecology

## 2018-01-21 DIAGNOSIS — Z1231 Encounter for screening mammogram for malignant neoplasm of breast: Secondary | ICD-10-CM

## 2018-12-22 ENCOUNTER — Other Ambulatory Visit: Payer: Self-pay | Admitting: Obstetrics and Gynecology

## 2018-12-22 DIAGNOSIS — Z1231 Encounter for screening mammogram for malignant neoplasm of breast: Secondary | ICD-10-CM

## 2019-02-10 ENCOUNTER — Other Ambulatory Visit: Payer: Self-pay

## 2019-02-10 ENCOUNTER — Ambulatory Visit
Admission: RE | Admit: 2019-02-10 | Discharge: 2019-02-10 | Disposition: A | Discharge status: Home / Self Care | Payer: BC Managed Care – PPO | Source: Ambulatory Visit | Attending: Obstetrics and Gynecology | Admitting: Obstetrics and Gynecology

## 2019-02-10 DIAGNOSIS — Z1231 Encounter for screening mammogram for malignant neoplasm of breast: Secondary | ICD-10-CM | POA: Diagnosis not present

## 2019-02-15 ENCOUNTER — Other Ambulatory Visit: Payer: Self-pay | Admitting: Obstetrics and Gynecology

## 2019-02-15 DIAGNOSIS — R928 Other abnormal and inconclusive findings on diagnostic imaging of breast: Secondary | ICD-10-CM

## 2019-02-24 ENCOUNTER — Ambulatory Visit
Admission: RE | Admit: 2019-02-24 | Discharge: 2019-02-24 | Disposition: A | Discharge status: Home / Self Care | Payer: BC Managed Care – PPO | Source: Ambulatory Visit | Attending: Obstetrics and Gynecology | Admitting: Obstetrics and Gynecology

## 2019-02-24 ENCOUNTER — Other Ambulatory Visit: Payer: Self-pay

## 2019-02-24 ENCOUNTER — Other Ambulatory Visit: Payer: Self-pay | Admitting: Obstetrics and Gynecology

## 2019-02-24 DIAGNOSIS — R922 Inconclusive mammogram: Secondary | ICD-10-CM | POA: Diagnosis not present

## 2019-02-24 DIAGNOSIS — N6021 Fibroadenosis of right breast: Secondary | ICD-10-CM | POA: Diagnosis not present

## 2019-02-24 DIAGNOSIS — N6320 Unspecified lump in the left breast, unspecified quadrant: Secondary | ICD-10-CM

## 2019-02-24 DIAGNOSIS — N6310 Unspecified lump in the right breast, unspecified quadrant: Secondary | ICD-10-CM

## 2019-02-24 DIAGNOSIS — R928 Other abnormal and inconclusive findings on diagnostic imaging of breast: Secondary | ICD-10-CM

## 2019-02-24 DIAGNOSIS — N6022 Fibroadenosis of left breast: Secondary | ICD-10-CM | POA: Diagnosis not present

## 2019-03-15 DIAGNOSIS — Z6826 Body mass index (BMI) 26.0-26.9, adult: Secondary | ICD-10-CM | POA: Diagnosis not present

## 2019-03-15 DIAGNOSIS — Z01419 Encounter for gynecological examination (general) (routine) without abnormal findings: Secondary | ICD-10-CM | POA: Diagnosis not present

## 2019-03-26 ENCOUNTER — Ambulatory Visit: Payer: BC Managed Care – PPO | Attending: Internal Medicine

## 2019-03-26 DIAGNOSIS — Z23 Encounter for immunization: Secondary | ICD-10-CM | POA: Insufficient documentation

## 2019-03-26 NOTE — Progress Notes (Signed)
   Covid-19 Vaccination Clinic  Name:  ROSE BEAULIEU    MRN: [id number] DOB: [birth year]  03/26/2019  Ms. Mizuno was observed post Covid-19 immunization for 15 minutes without incident. She was provided with Vaccine Information Sheet and instruction to access the V-Safe system.   Ms. Vides was instructed to call 911 with any severe reactions post vaccine: Marland Kitchen Difficulty breathing  . Swelling of face and throat  . A fast heartbeat  . A bad rash all over body  . Dizziness and weakness   Immunizations Administered    Name Date Dose VIS Date Route   Pfizer COVID-19 Vaccine 03/26/2019 10:01 AM 0.3 mL 12/31/2018 Intramuscular   Manufacturer: [place]   Lot: [id number]   [place]: [id number]

## 2019-04-16 ENCOUNTER — Ambulatory Visit: Payer: BC Managed Care – PPO | Attending: Internal Medicine

## 2019-04-16 DIAGNOSIS — Z23 Encounter for immunization: Secondary | ICD-10-CM

## 2019-04-16 NOTE — Progress Notes (Signed)
   Covid-19 Vaccination Clinic  Name:  Rachael Conley    MRN: [id number] DOB: 04-22-1970  04/16/2019  [name] was observed post Covid-19 immunization for 15 minutes without incident. She was provided with Vaccine Information Sheet and instruction to access the V-Safe system.   [name] was instructed to call 911 with any severe reactions post vaccine: Marland Kitchen Difficulty breathing  . Swelling of face and throat  . A fast heartbeat  . A bad rash all over body  . Dizziness and weakness   Immunizations Administered    Name Date Dose VIS Date Route   Pfizer COVID-19 Vaccine 04/16/2019 10:47 AM 0.3 mL 12/31/2018 Intramuscular   Manufacturer: [place]   Lot: [id number]   [place]: [id number]

## 2019-08-30 ENCOUNTER — Other Ambulatory Visit: Payer: Self-pay | Admitting: Obstetrics and Gynecology

## 2019-08-30 ENCOUNTER — Ambulatory Visit
Admission: RE | Admit: 2019-08-30 | Discharge: 2019-08-30 | Disposition: A | Discharge status: Home / Self Care | Payer: BC Managed Care – PPO | Source: Ambulatory Visit | Attending: Obstetrics and Gynecology | Admitting: Obstetrics and Gynecology

## 2019-08-30 ENCOUNTER — Other Ambulatory Visit: Payer: Self-pay

## 2019-08-30 DIAGNOSIS — N6323 Unspecified lump in the left breast, lower outer quadrant: Secondary | ICD-10-CM | POA: Diagnosis not present

## 2019-08-30 DIAGNOSIS — N6310 Unspecified lump in the right breast, unspecified quadrant: Secondary | ICD-10-CM

## 2019-08-30 DIAGNOSIS — N6320 Unspecified lump in the left breast, unspecified quadrant: Secondary | ICD-10-CM

## 2019-08-30 DIAGNOSIS — N6313 Unspecified lump in the right breast, lower outer quadrant: Secondary | ICD-10-CM | POA: Diagnosis not present

## 2019-08-30 DIAGNOSIS — N6324 Unspecified lump in the left breast, lower inner quadrant: Secondary | ICD-10-CM | POA: Diagnosis not present

## 2019-12-12 DIAGNOSIS — F4322 Adjustment disorder with anxiety: Secondary | ICD-10-CM | POA: Diagnosis not present

## 2019-12-28 DIAGNOSIS — F4322 Adjustment disorder with anxiety: Secondary | ICD-10-CM | POA: Diagnosis not present

## 2020-01-04 DIAGNOSIS — F4322 Adjustment disorder with anxiety: Secondary | ICD-10-CM | POA: Diagnosis not present

## 2020-01-23 DIAGNOSIS — F4322 Adjustment disorder with anxiety: Secondary | ICD-10-CM | POA: Diagnosis not present

## 2020-02-21 DIAGNOSIS — F4322 Adjustment disorder with anxiety: Secondary | ICD-10-CM | POA: Diagnosis not present

## 2020-03-02 ENCOUNTER — Ambulatory Visit
Admission: RE | Admit: 2020-03-02 | Discharge: 2020-03-02 | Disposition: A | Discharge status: Home / Self Care | Payer: BC Managed Care – PPO | Source: Ambulatory Visit | Attending: Obstetrics and Gynecology | Admitting: Obstetrics and Gynecology

## 2020-03-02 ENCOUNTER — Other Ambulatory Visit: Payer: Self-pay

## 2020-03-02 DIAGNOSIS — N6310 Unspecified lump in the right breast, unspecified quadrant: Secondary | ICD-10-CM

## 2020-03-02 DIAGNOSIS — N6320 Unspecified lump in the left breast, unspecified quadrant: Secondary | ICD-10-CM

## 2020-03-02 DIAGNOSIS — R922 Inconclusive mammogram: Secondary | ICD-10-CM | POA: Diagnosis not present

## 2020-03-02 DIAGNOSIS — N6324 Unspecified lump in the left breast, lower inner quadrant: Secondary | ICD-10-CM | POA: Diagnosis not present

## 2020-03-02 DIAGNOSIS — N6323 Unspecified lump in the left breast, lower outer quadrant: Secondary | ICD-10-CM | POA: Diagnosis not present

## 2020-03-02 DIAGNOSIS — N6314 Unspecified lump in the right breast, lower inner quadrant: Secondary | ICD-10-CM | POA: Diagnosis not present

## 2020-03-02 DIAGNOSIS — N6312 Unspecified lump in the right breast, upper inner quadrant: Secondary | ICD-10-CM | POA: Diagnosis not present

## 2020-03-02 DIAGNOSIS — N6313 Unspecified lump in the right breast, lower outer quadrant: Secondary | ICD-10-CM | POA: Diagnosis not present

## 2020-03-29 DIAGNOSIS — R319 Hematuria, unspecified: Secondary | ICD-10-CM | POA: Diagnosis not present

## 2020-03-29 DIAGNOSIS — Z01419 Encounter for gynecological examination (general) (routine) without abnormal findings: Secondary | ICD-10-CM | POA: Diagnosis not present

## 2020-03-29 DIAGNOSIS — Z6827 Body mass index (BMI) 27.0-27.9, adult: Secondary | ICD-10-CM | POA: Diagnosis not present

## 2020-09-26 DIAGNOSIS — Z0000 Encounter for general adult medical examination without abnormal findings: Secondary | ICD-10-CM | POA: Diagnosis not present

## 2021-03-04 ENCOUNTER — Other Ambulatory Visit: Payer: Self-pay | Admitting: Obstetrics and Gynecology

## 2021-03-04 DIAGNOSIS — N63 Unspecified lump in unspecified breast: Secondary | ICD-10-CM

## 2021-03-14 DIAGNOSIS — K621 Rectal polyp: Secondary | ICD-10-CM | POA: Diagnosis not present

## 2021-03-14 DIAGNOSIS — K648 Other hemorrhoids: Secondary | ICD-10-CM | POA: Diagnosis not present

## 2021-03-14 DIAGNOSIS — Z1211 Encounter for screening for malignant neoplasm of colon: Secondary | ICD-10-CM | POA: Diagnosis not present

## 2021-03-14 DIAGNOSIS — K644 Residual hemorrhoidal skin tags: Secondary | ICD-10-CM | POA: Diagnosis not present

## 2021-03-29 ENCOUNTER — Ambulatory Visit
Admission: RE | Admit: 2021-03-29 | Discharge: 2021-03-29 | Disposition: A | Discharge status: Home / Self Care | Payer: BC Managed Care – PPO | Source: Ambulatory Visit | Attending: Obstetrics and Gynecology | Admitting: Obstetrics and Gynecology

## 2021-03-29 DIAGNOSIS — N63 Unspecified lump in unspecified breast: Secondary | ICD-10-CM

## 2021-03-29 DIAGNOSIS — R922 Inconclusive mammogram: Secondary | ICD-10-CM | POA: Diagnosis not present

## 2021-05-20 DIAGNOSIS — Z01419 Encounter for gynecological examination (general) (routine) without abnormal findings: Secondary | ICD-10-CM | POA: Diagnosis not present

## 2021-05-20 DIAGNOSIS — Z6826 Body mass index (BMI) 26.0-26.9, adult: Secondary | ICD-10-CM | POA: Diagnosis not present

## 2021-06-11 DIAGNOSIS — L668 Other cicatricial alopecia: Secondary | ICD-10-CM | POA: Diagnosis not present

## 2021-10-04 DIAGNOSIS — Z0000 Encounter for general adult medical examination without abnormal findings: Secondary | ICD-10-CM | POA: Diagnosis not present

## 2022-01-09 DIAGNOSIS — L089 Local infection of the skin and subcutaneous tissue, unspecified: Secondary | ICD-10-CM | POA: Diagnosis not present

## 2022-01-09 DIAGNOSIS — L668 Other cicatricial alopecia: Secondary | ICD-10-CM | POA: Diagnosis not present

## 2022-01-09 DIAGNOSIS — L658 Other specified nonscarring hair loss: Secondary | ICD-10-CM | POA: Diagnosis not present

## 2022-02-27 ENCOUNTER — Other Ambulatory Visit: Payer: Self-pay | Admitting: Obstetrics and Gynecology

## 2022-02-27 DIAGNOSIS — Z1231 Encounter for screening mammogram for malignant neoplasm of breast: Secondary | ICD-10-CM

## 2022-04-16 ENCOUNTER — Ambulatory Visit
Admission: RE | Admit: 2022-04-16 | Discharge: 2022-04-16 | Disposition: A | Discharge status: Home / Self Care | Payer: No Typology Code available for payment source | Source: Ambulatory Visit | Attending: Obstetrics and Gynecology | Admitting: Obstetrics and Gynecology

## 2022-04-16 DIAGNOSIS — Z1231 Encounter for screening mammogram for malignant neoplasm of breast: Secondary | ICD-10-CM

## 2022-08-05 IMAGING — MG DIGITAL DIAGNOSTIC BILAT W/ TOMO W/ CAD
8 series · 8 of 24 positions shown · non-contrast
Comparison: Previous exam(s).

CLINICAL DATA: 50-year-old female for 2 year follow-up of bilateral
breast masses and for annual bilateral mammogram.



[L CC synth-2D]
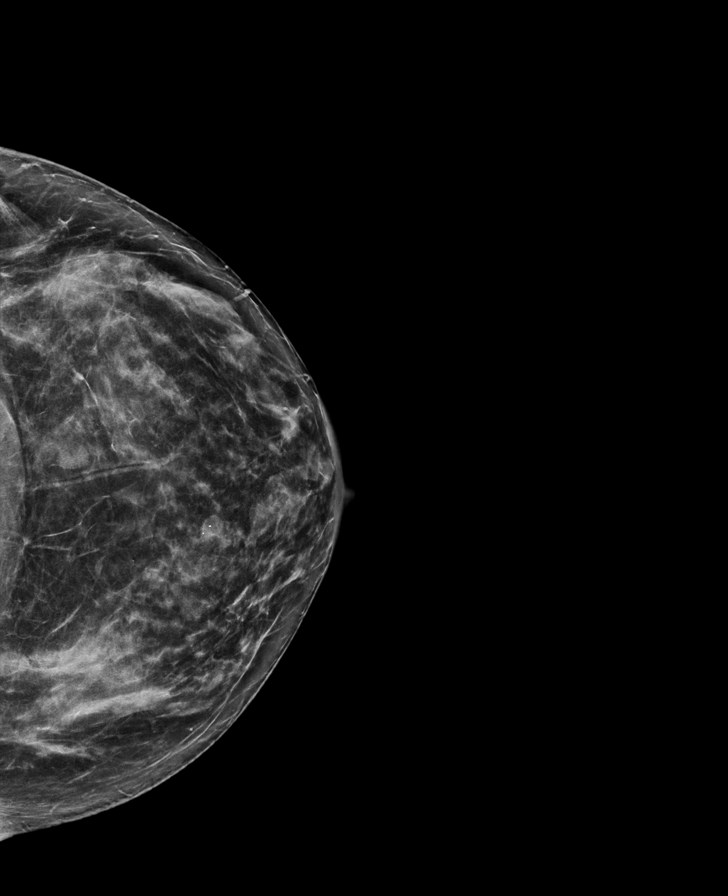

[R CC synth-2D]
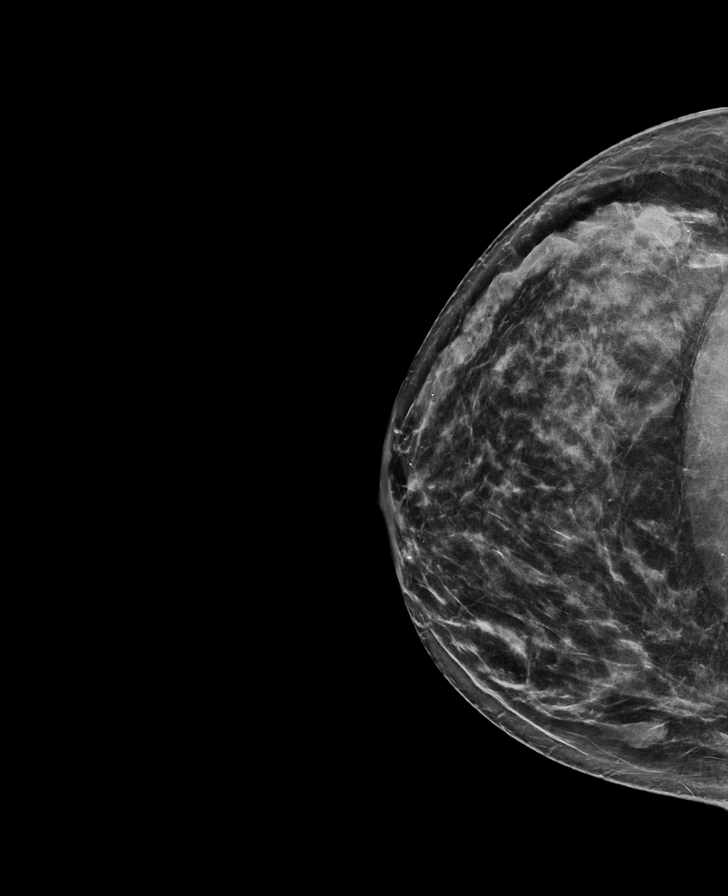

[R MLO synth-2D]
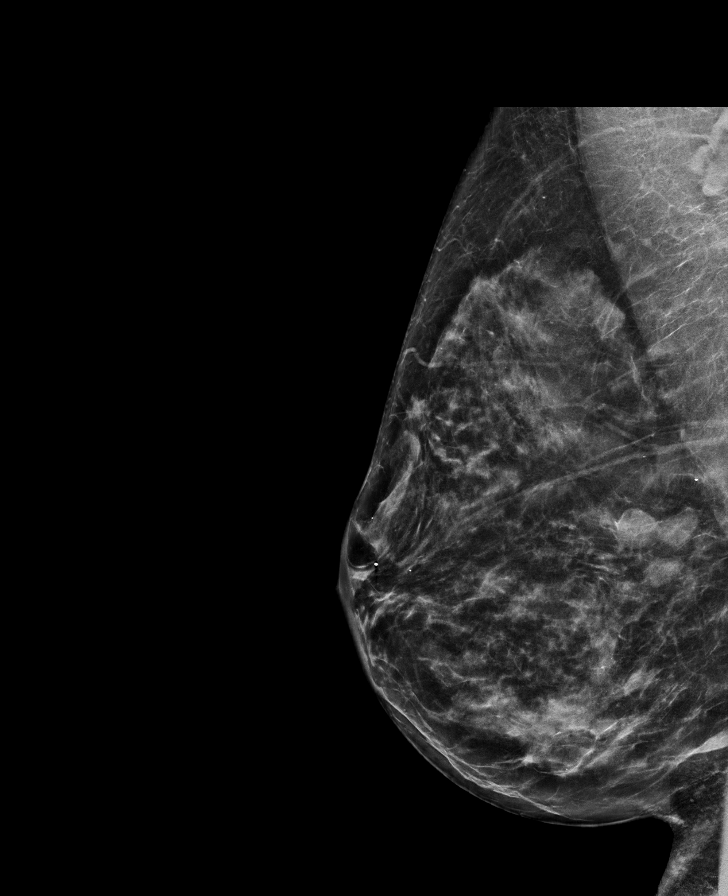

[L MLO synth-2D]
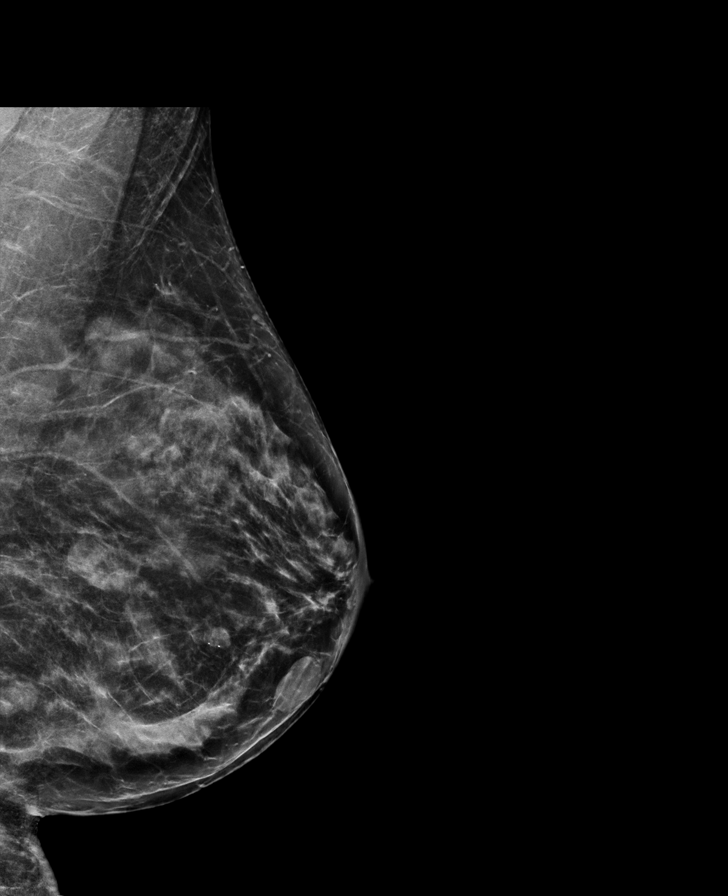

[L MLO tomo · tomo slice 36/71.0]
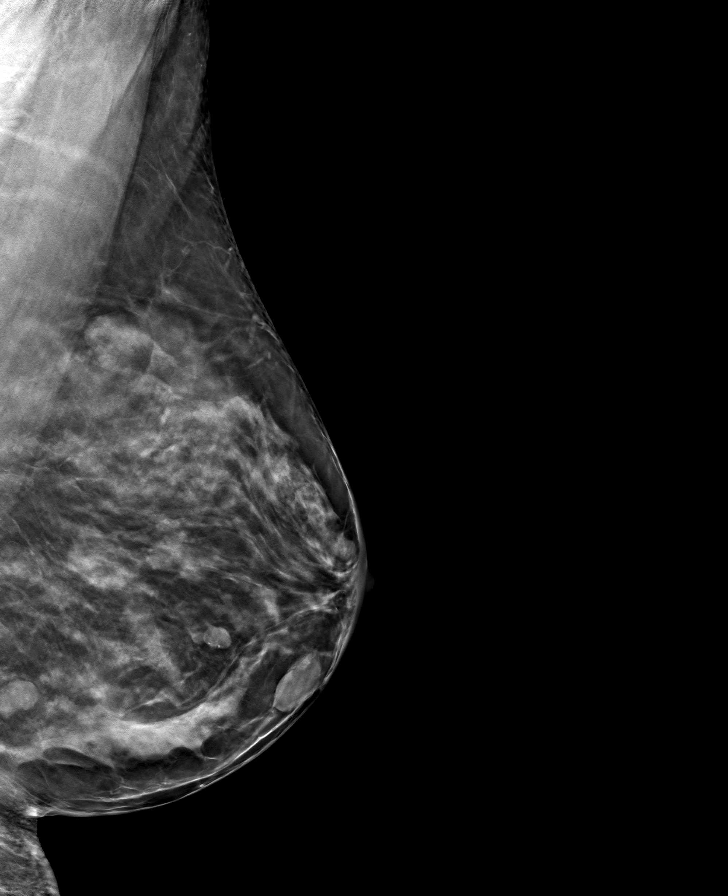

[R CC tomo · tomo slice 37/72.0]
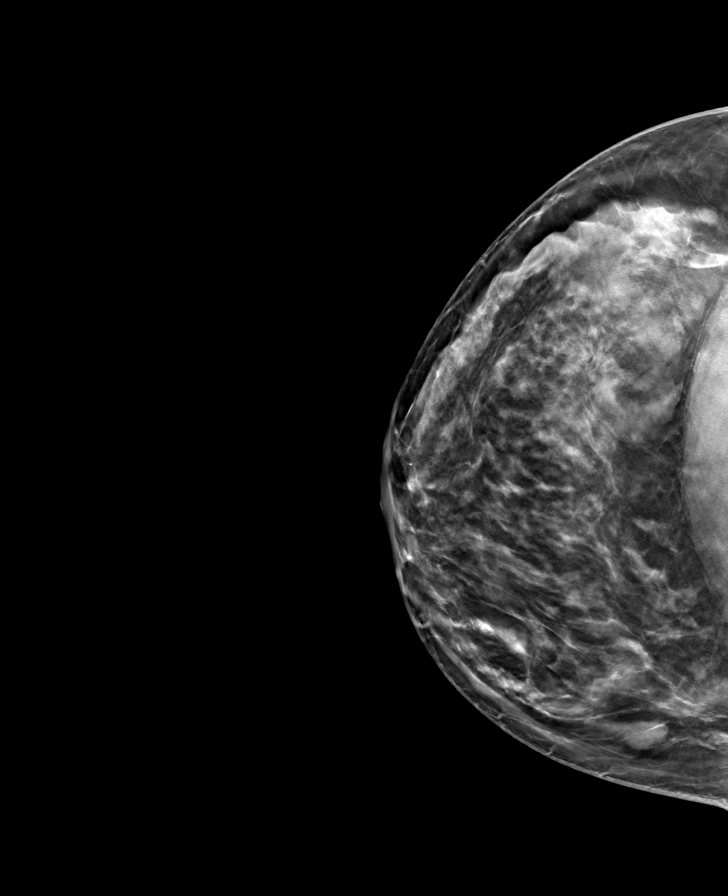

[L CC tomo · tomo slice 35/69.0]
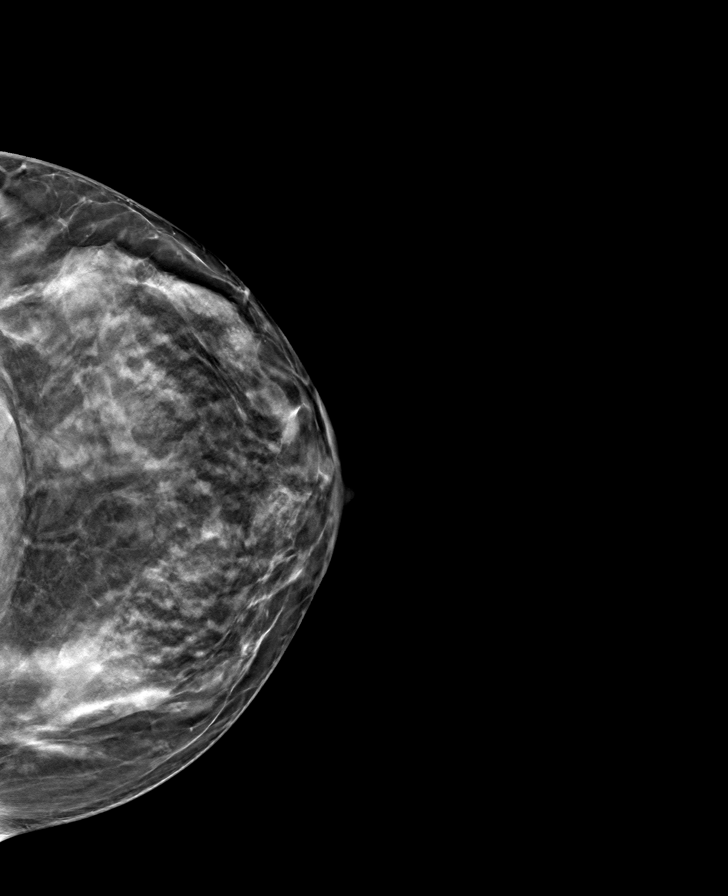

[R MLO tomo · tomo slice 37/72.0]
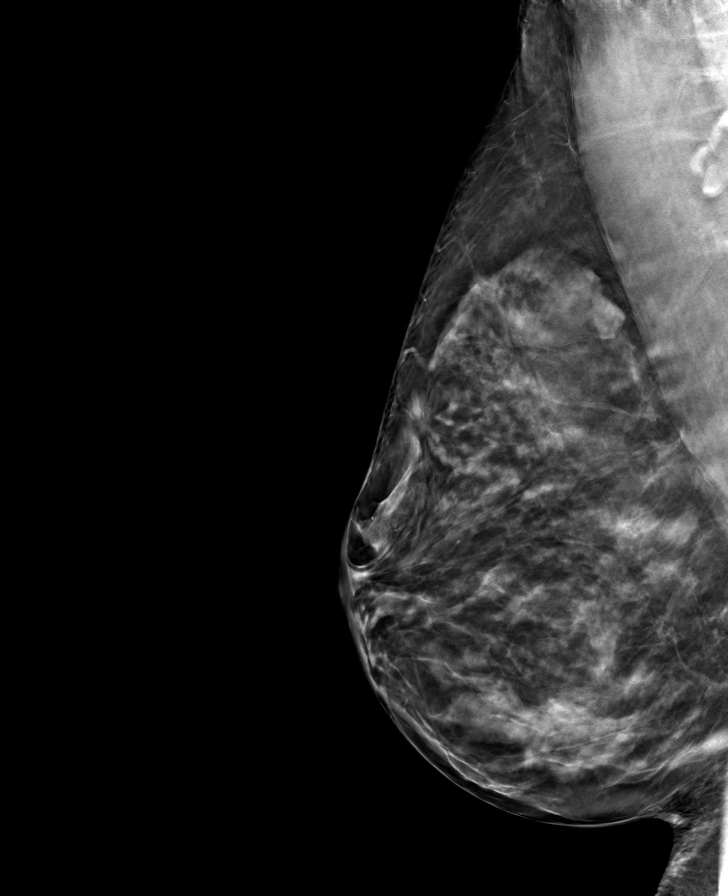

[8 of 24 positions shown; findings below may reference images not displayed]

ACR Breast Density Category c: The breast tissue is heterogeneously
dense, which may obscure small masses.
FINDINGS: 2D/3D full field views of both breasts again demonstrate bilateral
circumscribed oval masses. No new or suspicious mammographic
abnormalities are noted.

Reduction changes bilaterally are again identified.

Targeted ultrasound is performed, showing no significant changes in
circumscribed oval parallel masses within both breast as follows:

RIGHT breast:

A 0.9 x 0.4 x 0.6 cm mass at the [DATE] position 5 cm from the nipple.

A 0.9 x 0.5 x 1.1 cm mass at the 2 o'clock position 8 cm from the
nipple.

LEFT breast:

A 1 x 0.5 x 0.7 cm mass at the 6 o'clock position 3 cm from the
nipple.

A 0.7 x 0.7 x 1.5 cm mass at the 6 o'clock position 1 cm from the
nipple.

A 0.9 x 0.6 x 1.2 cm mass at the 8 o'clock position 6 cm from the
nipple.
IMPRESSION: 1. No new significant change in bilateral breast masses over a 2
year span, consistent with benign etiologies, likely fibroadenomas.
2. No new or suspicious mammographic findings within either breast.

RECOMMENDATION:
Bilateral screening mammogram in 1 year.

I have discussed the findings and recommendations with the patient.
If applicable, a reminder letter will be sent to the patient
regarding the next appointment.

BI-RADS CATEGORY  2: Benign.

## 2023-01-01 DIAGNOSIS — L659 Nonscarring hair loss, unspecified: Secondary | ICD-10-CM | POA: Diagnosis not present

## 2023-01-01 DIAGNOSIS — H6123 Impacted cerumen, bilateral: Secondary | ICD-10-CM | POA: Diagnosis not present

## 2023-01-01 DIAGNOSIS — I10 Essential (primary) hypertension: Secondary | ICD-10-CM | POA: Diagnosis not present

## 2023-01-26 DIAGNOSIS — L219 Seborrheic dermatitis, unspecified: Secondary | ICD-10-CM | POA: Diagnosis not present

## 2023-01-26 DIAGNOSIS — L6681 Central centrifugal cicatricial alopecia: Secondary | ICD-10-CM | POA: Diagnosis not present

## 2023-01-26 DIAGNOSIS — L089 Local infection of the skin and subcutaneous tissue, unspecified: Secondary | ICD-10-CM | POA: Diagnosis not present

## 2023-02-27 DIAGNOSIS — I10 Essential (primary) hypertension: Secondary | ICD-10-CM | POA: Diagnosis not present

## 2023-03-20 ENCOUNTER — Other Ambulatory Visit: Payer: Self-pay | Admitting: Obstetrics and Gynecology

## 2023-03-20 DIAGNOSIS — Z1231 Encounter for screening mammogram for malignant neoplasm of breast: Secondary | ICD-10-CM

## 2023-04-17 ENCOUNTER — Ambulatory Visit
Admission: RE | Admit: 2023-04-17 | Discharge: 2023-04-17 | Disposition: A | Discharge status: Home / Self Care | Payer: No Typology Code available for payment source | Source: Ambulatory Visit | Attending: Obstetrics and Gynecology | Admitting: Obstetrics and Gynecology

## 2023-04-17 DIAGNOSIS — Z1231 Encounter for screening mammogram for malignant neoplasm of breast: Secondary | ICD-10-CM

## 2023-06-16 DIAGNOSIS — Z01419 Encounter for gynecological examination (general) (routine) without abnormal findings: Secondary | ICD-10-CM | POA: Diagnosis not present

## 2023-06-16 DIAGNOSIS — Z6827 Body mass index (BMI) 27.0-27.9, adult: Secondary | ICD-10-CM | POA: Diagnosis not present

## 2023-09-24 DIAGNOSIS — L219 Seborrheic dermatitis, unspecified: Secondary | ICD-10-CM | POA: Diagnosis not present

## 2023-09-24 DIAGNOSIS — L089 Local infection of the skin and subcutaneous tissue, unspecified: Secondary | ICD-10-CM | POA: Diagnosis not present

## 2023-09-24 DIAGNOSIS — L658 Other specified nonscarring hair loss: Secondary | ICD-10-CM | POA: Diagnosis not present

## 2023-09-24 DIAGNOSIS — L6681 Central centrifugal cicatricial alopecia: Secondary | ICD-10-CM | POA: Diagnosis not present

## 2023-10-16 DIAGNOSIS — I10 Essential (primary) hypertension: Secondary | ICD-10-CM | POA: Diagnosis not present

## 2023-10-16 DIAGNOSIS — R5383 Other fatigue: Secondary | ICD-10-CM | POA: Diagnosis not present

## 2023-10-16 DIAGNOSIS — R0789 Other chest pain: Secondary | ICD-10-CM | POA: Diagnosis not present

## 2023-10-16 DIAGNOSIS — Z0000 Encounter for general adult medical examination without abnormal findings: Secondary | ICD-10-CM | POA: Diagnosis not present

## 2024-01-04 DIAGNOSIS — E559 Vitamin D deficiency, unspecified: Secondary | ICD-10-CM | POA: Diagnosis not present

## 2024-01-04 DIAGNOSIS — E876 Hypokalemia: Secondary | ICD-10-CM | POA: Diagnosis not present

## 2024-01-04 DIAGNOSIS — B9689 Other specified bacterial agents as the cause of diseases classified elsewhere: Secondary | ICD-10-CM | POA: Diagnosis not present

## 2024-01-04 DIAGNOSIS — J069 Acute upper respiratory infection, unspecified: Secondary | ICD-10-CM | POA: Diagnosis not present
